# Patient Record
Sex: Female | Born: 1970 | Race: White | Hispanic: No | Marital: Married | Smoking: Never smoker
Health system: Southern US, Community
[De-identification: ages and names within clinical notes are randomized; demographics above are authoritative.]

## PROBLEM LIST (undated history)

## (undated) DIAGNOSIS — G43909 Migraine, unspecified, not intractable, without status migrainosus: Secondary | ICD-10-CM

## (undated) DIAGNOSIS — R112 Nausea with vomiting, unspecified: Secondary | ICD-10-CM

## (undated) DIAGNOSIS — D219 Benign neoplasm of connective and other soft tissue, unspecified: Secondary | ICD-10-CM

## (undated) DIAGNOSIS — I10 Essential (primary) hypertension: Secondary | ICD-10-CM

## (undated) DIAGNOSIS — M7072 Other bursitis of hip, left hip: Secondary | ICD-10-CM

## (undated) DIAGNOSIS — D649 Anemia, unspecified: Secondary | ICD-10-CM

## (undated) DIAGNOSIS — E119 Type 2 diabetes mellitus without complications: Secondary | ICD-10-CM

## (undated) DIAGNOSIS — K219 Gastro-esophageal reflux disease without esophagitis: Secondary | ICD-10-CM

## (undated) DIAGNOSIS — J40 Bronchitis, not specified as acute or chronic: Secondary | ICD-10-CM

## (undated) DIAGNOSIS — Z9889 Other specified postprocedural states: Secondary | ICD-10-CM

## (undated) DIAGNOSIS — E039 Hypothyroidism, unspecified: Secondary | ICD-10-CM

## (undated) DIAGNOSIS — M7071 Other bursitis of hip, right hip: Secondary | ICD-10-CM

## (undated) DIAGNOSIS — R51 Headache: Secondary | ICD-10-CM

## (undated) HISTORY — PX: APPENDECTOMY: SHX54

## (undated) HISTORY — PX: DILATION AND CURETTAGE OF UTERUS: SHX78

## (undated) HISTORY — PX: WISDOM TOOTH EXTRACTION: SHX21

---

## 1997-08-30 ENCOUNTER — Other Ambulatory Visit: Admission: RE | Admit: 1997-08-30 | Discharge: 1997-08-30 | Payer: Self-pay | Admitting: Obstetrics and Gynecology

## 1997-09-28 ENCOUNTER — Ambulatory Visit (HOSPITAL_COMMUNITY): Admission: RE | Admit: 1997-09-28 | Discharge: 1997-09-28 | Payer: Self-pay | Admitting: Obstetrics and Gynecology

## 1998-09-04 ENCOUNTER — Other Ambulatory Visit: Admission: RE | Admit: 1998-09-04 | Discharge: 1998-09-04 | Payer: Self-pay | Admitting: Obstetrics and Gynecology

## 1999-10-01 ENCOUNTER — Other Ambulatory Visit: Admission: RE | Admit: 1999-10-01 | Discharge: 1999-10-01 | Payer: Self-pay | Admitting: Obstetrics and Gynecology

## 2000-12-11 ENCOUNTER — Other Ambulatory Visit: Admission: RE | Admit: 2000-12-11 | Discharge: 2000-12-11 | Payer: Self-pay | Admitting: Obstetrics and Gynecology

## 2000-12-12 ENCOUNTER — Other Ambulatory Visit: Admission: RE | Admit: 2000-12-12 | Discharge: 2000-12-12 | Payer: Self-pay | Admitting: Obstetrics and Gynecology

## 2001-12-14 ENCOUNTER — Other Ambulatory Visit: Admission: RE | Admit: 2001-12-14 | Discharge: 2001-12-14 | Payer: Self-pay | Admitting: Obstetrics and Gynecology

## 2002-12-20 ENCOUNTER — Other Ambulatory Visit: Admission: RE | Admit: 2002-12-20 | Discharge: 2002-12-20 | Payer: Self-pay | Admitting: Obstetrics and Gynecology

## 2011-03-27 ENCOUNTER — Other Ambulatory Visit: Payer: Self-pay | Admitting: Obstetrics and Gynecology

## 2011-03-27 DIAGNOSIS — Z1231 Encounter for screening mammogram for malignant neoplasm of breast: Secondary | ICD-10-CM

## 2011-04-17 ENCOUNTER — Ambulatory Visit
Admission: RE | Admit: 2011-04-17 | Discharge: 2011-04-17 | Disposition: A | Discharge status: Home / Self Care | Payer: BC Managed Care – PPO | Source: Ambulatory Visit | Attending: Obstetrics and Gynecology | Admitting: Obstetrics and Gynecology

## 2011-04-17 DIAGNOSIS — Z1231 Encounter for screening mammogram for malignant neoplasm of breast: Secondary | ICD-10-CM

## 2012-03-09 ENCOUNTER — Other Ambulatory Visit: Payer: Self-pay | Admitting: Obstetrics and Gynecology

## 2012-03-09 DIAGNOSIS — Z1231 Encounter for screening mammogram for malignant neoplasm of breast: Secondary | ICD-10-CM

## 2012-04-17 ENCOUNTER — Ambulatory Visit
Admission: RE | Admit: 2012-04-17 | Discharge: 2012-04-17 | Disposition: A | Discharge status: Home / Self Care | Payer: BC Managed Care – PPO | Source: Ambulatory Visit | Attending: Obstetrics and Gynecology | Admitting: Obstetrics and Gynecology

## 2012-04-17 DIAGNOSIS — Z1231 Encounter for screening mammogram for malignant neoplasm of breast: Secondary | ICD-10-CM

## 2012-12-21 ENCOUNTER — Ambulatory Visit: Payer: Self-pay | Admitting: Neurology

## 2012-12-21 LAB — CREATININE, SERUM
Creatinine: 0.94 mg/dL (ref 0.60–1.30)
EGFR (Non-African Amer.): >60

## 2013-03-16 ENCOUNTER — Other Ambulatory Visit: Payer: Self-pay

## 2013-03-16 DIAGNOSIS — Z1231 Encounter for screening mammogram for malignant neoplasm of breast: Secondary | ICD-10-CM

## 2013-04-26 ENCOUNTER — Ambulatory Visit
Admission: RE | Admit: 2013-04-26 | Discharge: 2013-04-26 | Disposition: A | Discharge status: Home / Self Care | Payer: BC Managed Care – PPO | Source: Ambulatory Visit

## 2013-04-26 DIAGNOSIS — Z1231 Encounter for screening mammogram for malignant neoplasm of breast: Secondary | ICD-10-CM

## 2013-05-27 HISTORY — PX: ABDOMINAL HYSTERECTOMY: SHX81

## 2013-09-07 ENCOUNTER — Encounter (HOSPITAL_COMMUNITY): Payer: Self-pay | Admitting: Pharmacist

## 2013-09-09 ENCOUNTER — Other Ambulatory Visit: Payer: Self-pay | Admitting: Obstetrics and Gynecology

## 2013-09-13 ENCOUNTER — Encounter (HOSPITAL_COMMUNITY)
Admission: RE | Admit: 2013-09-13 | Discharge: 2013-09-13 | Disposition: A | Discharge status: Home / Self Care | Payer: BC Managed Care – PPO | Source: Ambulatory Visit | Attending: Obstetrics and Gynecology | Admitting: Obstetrics and Gynecology

## 2013-09-13 ENCOUNTER — Encounter (HOSPITAL_COMMUNITY): Payer: Self-pay

## 2013-09-13 DIAGNOSIS — Z01812 Encounter for preprocedural laboratory examination: Secondary | ICD-10-CM | POA: Insufficient documentation

## 2013-09-13 DIAGNOSIS — Z01810 Encounter for preprocedural cardiovascular examination: Secondary | ICD-10-CM | POA: Insufficient documentation

## 2013-09-13 HISTORY — DX: Essential (primary) hypertension: I10

## 2013-09-13 HISTORY — DX: Benign neoplasm of connective and other soft tissue, unspecified: D21.9

## 2013-09-13 HISTORY — DX: Other specified postprocedural states: R11.2

## 2013-09-13 HISTORY — DX: Headache: R51

## 2013-09-13 HISTORY — DX: Anemia, unspecified: D64.9

## 2013-09-13 HISTORY — DX: Hypothyroidism, unspecified: E03.9

## 2013-09-13 HISTORY — DX: Nausea with vomiting, unspecified: Z98.890

## 2013-09-13 LAB — CBC
HCT: 34.3 % — ABNORMAL LOW (ref 36.0–46.0)
Hemoglobin: 11.2 g/dL — ABNORMAL LOW (ref 12.0–15.0)
MCH: 29.2 pg (ref 26.0–34.0)
MCHC: 32.7 g/dL (ref 30.0–36.0)
MCV: 89.3 fL (ref 78.0–100.0)
PLATELETS: 237 10*3/uL (ref 150–400)
RBC: 3.84 MIL/uL — AB (ref 3.87–5.11)
RDW: 14.4 % (ref 11.5–15.5)
WBC: 4.1 10*3/uL (ref 4.0–10.5)

## 2013-09-13 LAB — BASIC METABOLIC PANEL
BUN: 15 mg/dL (ref 6–23)
CALCIUM: 8.9 mg/dL (ref 8.4–10.5)
CO2: 24 mEq/L (ref 19–32)
Chloride: 107 mEq/L (ref 96–112)
Creatinine, Ser: 0.86 mg/dL (ref 0.50–1.10)
GFR calc non Af Amer: 82 mL/min — ABNORMAL LOW (ref >90)
Glucose, Bld: 71 mg/dL (ref 70–99)
Potassium: 3.8 mEq/L (ref 3.7–5.3)
SODIUM: 142 meq/L (ref 137–147)

## 2013-09-13 NOTE — Patient Instructions (Addendum)
   Your procedure is scheduled on:  Wednesday, April 29  Enter through the [employer] of [facility] at:   7 AM Pick up the phone at the desk and dial [phone] and inform us of your arrival.  Please call this number if you have any problems the morning of surgery: [phone]  Remember: Do not eat or drink after midnight: Tuesday Take these medicines the morning of surgery with a SIP OF WATER:  Amlodipine, synthroid, topomax  Do not wear jewelry, make-up, or FINGER nail polish No metal in your hair or on your body. Do not wear lotions, powders, perfumes.  You may wear deodorant.  Do not bring valuables to the hospital. Contacts, dentures or bridgework may not be worn into surgery.  Leave suitcase in the car. After Surgery it may be brought to your room. For patients being admitted to the hospital, checkout time is 11:00am the day of discharge.  Home with - sister Vaughan Basta cell [phone].

## 2013-09-21 MED ORDER — CEFAZOLIN SODIUM-DEXTROSE 2-3 GM-% IV SOLR
2.0000 g | INTRAVENOUS | Status: AC
  Administered 2013-09-22: 2 g via INTRAVENOUS

## 2013-09-21 NOTE — H&P (Signed)
NAME:  Beth Williamson, Beth Williamson                  ACCOUNT NO.:  [id number]  MEDICAL RECORD NO.:  [id number]  LOCATION:  PERIO                         FACILITY:  [facility]  PHYSICIAN:  Lovenia Kim, M.D.DATE OF BIRTH:  1970/10/30  DATE OF ADMISSION:  08/12/2013 DATE OF DISCHARGE:  09/13/2013                             HISTORY & PHYSICAL   CHIEF COMPLAINT:  Menometrorrhagia and dysmenorrhea.  HISTORY OF PRESENT ILLNESS:  A 43 year old white female, G2, P1, with refractory menometrorrhagia and dysmenorrhea, who presents for definitive therapy.  History of hypertension, not a candidate for hormonal therapy.  PAST MEDICAL HISTORY:  Remarkable for hypothyroidism, hypertension, migraine headaches, and diet-controlled diabetes.  ALLERGIES:  She has no known drug allergies.  FAMILY HISTORY:  Hypertension, myocardial infarction, heart disease, diabetes.  PREGNANCY HISTORY:  one uncomplicated birth and one uncomplicated spontaneous abortion.  SURGICAL HISTORY:  Appendectomy at age 2.  MEDICATIONS:  Effexor, fish oil, aspirin, multivitamin, Maxalt, Synthroid, Norvasc, Topamax.  PHYSICAL EXAMINATION:  A well-developed, well-nourished white female, in no acute distress. HEENT:  Normal. NECK:  Supple.  Full range of motion. LUNGS:  Clear. HEART:  Regular rate and rhythm. ABDOMEN:  Soft, nontender. PELVIC:  Revealed an anteflexed uterus, bulky, and no adnexal masses. EXTREMITIES:  No cords. NEUROLOGIC:  Nonfocal. SKIN:  Intact.  IMPRESSION:  Menometrorrhagia refractory to medical therapy, desires definitive therapy.  PLAN:  Proceed with da Vinci assisted total laparoscopic hysterectomy, bilateral salpingectomy.  Risks of anesthesia, infection, bleeding, injury to surrounding organs, and possible need for repair was discussed, delayed versus immediate complications to include bowel and bladder injury noted.  The patient acknowledges and wishes to proceed.     Lovenia Kim,  M.D.     RJT/MEDQ  D:  09/21/2013  T:  09/21/2013  Job:  [id number]

## 2013-09-22 ENCOUNTER — Encounter (HOSPITAL_COMMUNITY): Payer: Self-pay | Admitting: Anesthesiology

## 2013-09-22 ENCOUNTER — Encounter (HOSPITAL_COMMUNITY): Payer: BC Managed Care – PPO | Admitting: Anesthesiology

## 2013-09-22 ENCOUNTER — Ambulatory Visit (HOSPITAL_COMMUNITY)
Admission: RE | Admit: 2013-09-22 | Discharge: 2013-09-23 | Disposition: A | Discharge status: Home / Self Care | Payer: BC Managed Care – PPO | Source: Ambulatory Visit | Attending: Obstetrics and Gynecology | Admitting: Obstetrics and Gynecology

## 2013-09-22 ENCOUNTER — Ambulatory Visit (HOSPITAL_COMMUNITY): Payer: BC Managed Care – PPO | Admitting: Anesthesiology

## 2013-09-22 ENCOUNTER — Encounter (HOSPITAL_COMMUNITY)
Admission: RE | Disposition: A | Discharge status: Home / Self Care | Payer: Self-pay | Source: Ambulatory Visit | Attending: Obstetrics and Gynecology

## 2013-09-22 DIAGNOSIS — D252 Subserosal leiomyoma of uterus: Secondary | ICD-10-CM | POA: Insufficient documentation

## 2013-09-22 DIAGNOSIS — N838 Other noninflammatory disorders of ovary, fallopian tube and broad ligament: Secondary | ICD-10-CM | POA: Insufficient documentation

## 2013-09-22 DIAGNOSIS — E119 Type 2 diabetes mellitus without complications: Secondary | ICD-10-CM | POA: Insufficient documentation

## 2013-09-22 DIAGNOSIS — N920 Excessive and frequent menstruation with regular cycle: Secondary | ICD-10-CM | POA: Insufficient documentation

## 2013-09-22 DIAGNOSIS — N946 Dysmenorrhea, unspecified: Secondary | ICD-10-CM | POA: Insufficient documentation

## 2013-09-22 DIAGNOSIS — N8000 Endometriosis of the uterus, unspecified: Secondary | ICD-10-CM | POA: Insufficient documentation

## 2013-09-22 DIAGNOSIS — D649 Anemia, unspecified: Secondary | ICD-10-CM | POA: Insufficient documentation

## 2013-09-22 DIAGNOSIS — D219 Benign neoplasm of connective and other soft tissue, unspecified: Secondary | ICD-10-CM | POA: Diagnosis present

## 2013-09-22 DIAGNOSIS — E039 Hypothyroidism, unspecified: Secondary | ICD-10-CM | POA: Insufficient documentation

## 2013-09-22 DIAGNOSIS — I10 Essential (primary) hypertension: Secondary | ICD-10-CM | POA: Insufficient documentation

## 2013-09-22 LAB — HCG, SERUM, QUALITATIVE: PREG SERUM: NEGATIVE

## 2013-09-22 SURGERY — ROBOTIC SINGLE SITE TOTAL HYSTERECTOMY
Anesthesia: General | Site: Abdomen

## 2013-09-22 MED ORDER — MIDAZOLAM HCL 2 MG/2ML IJ SOLN
INTRAMUSCULAR | Status: DC | PRN
  Administered 2013-09-22: 2 mg via INTRAVENOUS

## 2013-09-22 MED ORDER — DIPHENHYDRAMINE HCL 12.5 MG/5ML PO ELIX
12.5000 mg | ORAL_SOLUTION | Freq: Four times a day (QID) | ORAL | Status: DC | PRN
  Filled 2013-09-22: qty 5

## 2013-09-22 MED ORDER — ONDANSETRON HCL 4 MG/2ML IJ SOLN
INTRAMUSCULAR | Status: AC
  Filled 2013-09-22: qty 2

## 2013-09-22 MED ORDER — NEOSTIGMINE METHYLSULFATE 1 MG/ML IJ SOLN
INTRAMUSCULAR | Status: AC
  Filled 2013-09-22: qty 1

## 2013-09-22 MED ORDER — DEXTROSE IN LACTATED RINGERS 5 % IV SOLN
INTRAVENOUS | Status: DC
  Administered 2013-09-22 (×2): via INTRAVENOUS

## 2013-09-22 MED ORDER — CEFAZOLIN SODIUM-DEXTROSE 2-3 GM-% IV SOLR
INTRAVENOUS | Status: AC
  Filled 2013-09-22: qty 50

## 2013-09-22 MED ORDER — ARTIFICIAL TEARS OP OINT
TOPICAL_OINTMENT | OPHTHALMIC | Status: AC
  Filled 2013-09-22: qty 3.5

## 2013-09-22 MED ORDER — ROCURONIUM BROMIDE 100 MG/10ML IV SOLN
INTRAVENOUS | Status: AC
Start: 2013-09-22 — End: 2013-09-22
  Filled 2013-09-22: qty 1

## 2013-09-22 MED ORDER — DIPHENHYDRAMINE HCL 50 MG/ML IJ SOLN: 12.5000 mg | Freq: Four times a day (QID) | INTRAMUSCULAR | Status: DC | PRN

## 2013-09-22 MED ORDER — GLYCOPYRROLATE 0.2 MG/ML IJ SOLN
INTRAMUSCULAR | Status: AC
  Filled 2013-09-22: qty 3

## 2013-09-22 MED ORDER — LACTATED RINGERS IR SOLN
Status: DC | PRN
Start: 2013-09-22 — End: 2013-09-22
  Administered 2013-09-22: 3000 mL

## 2013-09-22 MED ORDER — FENTANYL CITRATE 0.05 MG/ML IJ SOLN
INTRAMUSCULAR | Status: DC | PRN
  Administered 2013-09-22 (×6): 50 ug via INTRAVENOUS

## 2013-09-22 MED ORDER — ROPIVACAINE HCL 5 MG/ML IJ SOLN
INTRAMUSCULAR | Status: DC | PRN
  Administered 2013-09-22: 120 mL

## 2013-09-22 MED ORDER — KETOROLAC TROMETHAMINE 30 MG/ML IJ SOLN
INTRAMUSCULAR | Status: AC
  Filled 2013-09-22: qty 1

## 2013-09-22 MED ORDER — SCOPOLAMINE 1 MG/3DAYS TD PT72
MEDICATED_PATCH | TRANSDERMAL | Status: DC
Start: 2013-09-22 — End: 2013-09-23
  Administered 2013-09-22: 1.5 mg via TRANSDERMAL
  Filled 2013-09-22: qty 1

## 2013-09-22 MED ORDER — DEXAMETHASONE SODIUM PHOSPHATE 10 MG/ML IJ SOLN
INTRAMUSCULAR | Status: DC | PRN
  Administered 2013-09-22: 10 mg via INTRAVENOUS

## 2013-09-22 MED ORDER — FENTANYL CITRATE 0.05 MG/ML IJ SOLN
INTRAMUSCULAR | Status: AC
  Filled 2013-09-22: qty 5

## 2013-09-22 MED ORDER — PROPOFOL 10 MG/ML IV EMUL
INTRAVENOUS | Status: AC
  Filled 2013-09-22: qty 20

## 2013-09-22 MED ORDER — FENTANYL CITRATE 0.05 MG/ML IJ SOLN
INTRAMUSCULAR | Status: AC
  Filled 2013-09-22: qty 2

## 2013-09-22 MED ORDER — METOCLOPRAMIDE HCL 5 MG/ML IJ SOLN: 10.0000 mg | Freq: Once | INTRAMUSCULAR | Status: DC | PRN

## 2013-09-22 MED ORDER — PROPOFOL 10 MG/ML IV BOLUS
INTRAVENOUS | Status: DC | PRN
  Administered 2013-09-22: 180 mg via INTRAVENOUS

## 2013-09-22 MED ORDER — LIDOCAINE HCL (CARDIAC) 20 MG/ML IV SOLN
INTRAVENOUS | Status: DC | PRN
  Administered 2013-09-22: 70 mg via INTRAVENOUS
  Administered 2013-09-22: 30 mg via INTRAVENOUS

## 2013-09-22 MED ORDER — OXYCODONE-ACETAMINOPHEN 5-325 MG PO TABS
1.0000 | ORAL_TABLET | ORAL | Status: DC | PRN
  Administered 2013-09-23: 1 via ORAL
  Filled 2013-09-22: qty 1

## 2013-09-22 MED ORDER — ONDANSETRON HCL 4 MG/2ML IJ SOLN
INTRAMUSCULAR | Status: DC | PRN
  Administered 2013-09-22: 4 mg via INTRAVENOUS

## 2013-09-22 MED ORDER — TOPIRAMATE 100 MG PO TABS
100.0000 mg | ORAL_TABLET | Freq: Every morning | ORAL | Status: DC
  Filled 2013-09-22: qty 1

## 2013-09-22 MED ORDER — LEVOTHYROXINE SODIUM 137 MCG PO TABS
137.0000 ug | ORAL_TABLET | Freq: Every day | ORAL | Status: DC
  Administered 2013-09-23: 137 ug via ORAL
  Filled 2013-09-22: qty 1

## 2013-09-22 MED ORDER — LACTATED RINGERS IV SOLN
INTRAVENOUS | Status: DC | PRN
  Administered 2013-09-22 (×2): via INTRAVENOUS

## 2013-09-22 MED ORDER — DEXAMETHASONE SODIUM PHOSPHATE 10 MG/ML IJ SOLN
INTRAMUSCULAR | Status: AC
  Filled 2013-09-22: qty 1

## 2013-09-22 MED ORDER — TRAMADOL HCL 50 MG PO TABS
50.0000 mg | ORAL_TABLET | Freq: Four times a day (QID) | ORAL | Status: DC | PRN
  Administered 2013-09-23: 50 mg via ORAL
  Filled 2013-09-22: qty 1

## 2013-09-22 MED ORDER — FENTANYL CITRATE 0.05 MG/ML IJ SOLN
INTRAMUSCULAR | Status: AC
  Administered 2013-09-22: 50 ug via INTRAVENOUS
  Filled 2013-09-22: qty 2

## 2013-09-22 MED ORDER — MIDAZOLAM HCL 2 MG/2ML IJ SOLN
INTRAMUSCULAR | Status: AC
  Filled 2013-09-22: qty 2

## 2013-09-22 MED ORDER — ROPIVACAINE HCL 5 MG/ML IJ SOLN
INTRAMUSCULAR | Status: AC
  Filled 2013-09-22: qty 60

## 2013-09-22 MED ORDER — AMLODIPINE BESYLATE 2.5 MG PO TABS
2.5000 mg | ORAL_TABLET | Freq: Every day | ORAL | Status: DC
  Filled 2013-09-22: qty 1

## 2013-09-22 MED ORDER — NEOSTIGMINE METHYLSULFATE 1 MG/ML IJ SOLN
INTRAMUSCULAR | Status: DC | PRN
  Administered 2013-09-22: 3 mg via INTRAVENOUS

## 2013-09-22 MED ORDER — LACTATED RINGERS IV SOLN
INTRAVENOUS | Status: DC
Start: 2013-09-22 — End: 2013-09-22
  Administered 2013-09-22: 08:00:00 via INTRAVENOUS

## 2013-09-22 MED ORDER — ROCURONIUM BROMIDE 100 MG/10ML IV SOLN
INTRAVENOUS | Status: DC | PRN
  Administered 2013-09-22 (×2): 10 mg via INTRAVENOUS
  Administered 2013-09-22: 50 mg via INTRAVENOUS

## 2013-09-22 MED ORDER — KETOROLAC TROMETHAMINE 30 MG/ML IJ SOLN
INTRAMUSCULAR | Status: DC | PRN
  Administered 2013-09-22: 30 mg via INTRAVENOUS

## 2013-09-22 MED ORDER — LIDOCAINE HCL (CARDIAC) 20 MG/ML IV SOLN
INTRAVENOUS | Status: AC
  Filled 2013-09-22: qty 5

## 2013-09-22 MED ORDER — SODIUM CHLORIDE 0.9 % IJ SOLN
INTRAMUSCULAR | Status: AC
  Filled 2013-09-22: qty 10

## 2013-09-22 MED ORDER — TOPIRAMATE 100 MG PO TABS: 100.0000 mg | ORAL_TABLET | Freq: Two times a day (BID) | ORAL | Status: DC

## 2013-09-22 MED ORDER — FENTANYL CITRATE 0.05 MG/ML IJ SOLN
25.0000 ug | INTRAMUSCULAR | Status: DC | PRN
  Administered 2013-09-22: 50 ug via INTRAVENOUS
  Administered 2013-09-22 (×2): 25 ug via INTRAVENOUS

## 2013-09-22 MED ORDER — TOPIRAMATE 100 MG PO TABS
300.0000 mg | ORAL_TABLET | Freq: Every evening | ORAL | Status: DC
  Filled 2013-09-22: qty 3

## 2013-09-22 MED ORDER — MEPERIDINE HCL 25 MG/ML IJ SOLN: 6.2500 mg | INTRAMUSCULAR | Status: DC | PRN

## 2013-09-22 MED ORDER — SCOPOLAMINE 1 MG/3DAYS TD PT72
1.0000 | MEDICATED_PATCH | TRANSDERMAL | Status: DC
  Administered 2013-09-22: 1.5 mg via TRANSDERMAL

## 2013-09-22 MED ORDER — SODIUM CHLORIDE 0.9 % IJ SOLN: 9.0000 mL | INTRAMUSCULAR | Status: DC | PRN

## 2013-09-22 MED ORDER — ONDANSETRON HCL 4 MG/2ML IJ SOLN
4.0000 mg | Freq: Four times a day (QID) | INTRAMUSCULAR | Status: DC | PRN
  Administered 2013-09-22: 4 mg via INTRAVENOUS
  Filled 2013-09-22: qty 2

## 2013-09-22 MED ORDER — GLYCOPYRROLATE 0.2 MG/ML IJ SOLN
INTRAMUSCULAR | Status: DC | PRN
  Administered 2013-09-22: 0.6 mg via INTRAVENOUS

## 2013-09-22 MED ORDER — HYDROMORPHONE 0.3 MG/ML IV SOLN
INTRAVENOUS | Status: DC
  Administered 2013-09-22: 0.6 mg via INTRAVENOUS
  Administered 2013-09-22: 13:00:00 via INTRAVENOUS
  Administered 2013-09-22: 0.6 mg via INTRAVENOUS
  Filled 2013-09-22: qty 25

## 2013-09-22 MED ORDER — NALOXONE HCL 0.4 MG/ML IJ SOLN: 0.4000 mg | INTRAMUSCULAR | Status: DC | PRN

## 2013-09-22 MED ORDER — SODIUM CHLORIDE 0.9 % IJ SOLN
INTRAMUSCULAR | Status: AC
  Filled 2013-09-22: qty 50

## 2013-09-22 SURGICAL SUPPLY — 74 items
BAG URINE DRAINAGE (UROLOGICAL SUPPLIES) ×3 IMPLANT
BARRIER ADHS 3X4 INTERCEED (GAUZE/BANDAGES/DRESSINGS) ×3 IMPLANT
CANNULA SEAL 5MM (CANNULA) ×1
CANNULA SEALS 8.5MM (CANNULA) ×1
CATH FOLEY 3WAY  5CC 16FR (CATHETERS) ×1
CATH FOLEY 3WAY 5CC 16FR (CATHETERS) ×2 IMPLANT
CHLORAPREP W/TINT 26ML (MISCELLANEOUS) ×3 IMPLANT
CLOTH BEACON ORANGE TIMEOUT ST (SAFETY) ×3 IMPLANT
CONT PATH 16OZ SNAP LID 3702 (MISCELLANEOUS) ×3 IMPLANT
COVER MAYO STAND STRL (DRAPES) ×3 IMPLANT
COVER TABLE BACK 60X90 (DRAPES) ×6 IMPLANT
COVER TIP SHEARS 8 DVNC (MISCELLANEOUS) ×2 IMPLANT
COVER TIP SHEARS 8MM DA VINCI (MISCELLANEOUS) ×1
DECANTER SPIKE VIAL GLASS SM (MISCELLANEOUS) ×12 IMPLANT
DERMABOND ADVANCED (GAUZE/BANDAGES/DRESSINGS) ×1
DERMABOND ADVANCED .7 DNX12 (GAUZE/BANDAGES/DRESSINGS) ×2 IMPLANT
DRAPE HUG U DISPOSABLE (DRAPE) ×3 IMPLANT
DRAPE LG THREE QUARTER DISP (DRAPES) ×6 IMPLANT
DRAPE WARM FLUID 44X44 (DRAPE) ×3 IMPLANT
DRSG TEGADERM 4X4.75 (GAUZE/BANDAGES/DRESSINGS) ×3 IMPLANT
ELECT REM PT RETURN 9FT ADLT (ELECTROSURGICAL) ×3
ELECTRODE REM PT RTRN 9FT ADLT (ELECTROSURGICAL) ×2 IMPLANT
EVACUATOR SMOKE 8.L (FILTER) ×3 IMPLANT
FORCEPS BI-POLAR FEN SS 5MM (FORCEP) ×3 IMPLANT
GAUZE VASELINE 3X9 (GAUZE/BANDAGES/DRESSINGS) IMPLANT
GLOVE BIO SURGEON STRL SZ7.5 (GLOVE) ×6 IMPLANT
GOWN STRL REUS W/TWL LRG LVL3 (GOWN DISPOSABLE) ×18 IMPLANT
GYRUS RUMI II 2.5CM BLUE (DISPOSABLE)
GYRUS RUMI II 3.5CM BLUE (DISPOSABLE)
GYRUS RUMI II 4.0CM BLUE (DISPOSABLE)
KIT ACCESSORY DA VINCI DISP (KITS) ×1
KIT ACCESSORY DVNC DISP (KITS) ×2 IMPLANT
LEGGING LITHOTOMY PAIR STRL (DRAPES) ×3 IMPLANT
NEEDLE INSUFFLATION 120MM (ENDOMECHANICALS) ×3 IMPLANT
NEEDLE INSUFFLATION 150MM (ENDOMECHANICALS) ×3 IMPLANT
OCCLUDER COLPOPNEUMO (BALLOONS) ×3 IMPLANT
PACK LAVH (CUSTOM PROCEDURE TRAY) ×3 IMPLANT
PAD PREP 24X48 CUFFED NSTRL (MISCELLANEOUS) ×6 IMPLANT
PLUG CATH AND CAP STER (CATHETERS) ×3 IMPLANT
PORT ENDOSCOPE SS 8.5MM (MISCELLANEOUS) ×3 IMPLANT
PROTECTOR NERVE ULNAR (MISCELLANEOUS) ×6 IMPLANT
RETRACTOR WOUND ALXS 19CM XSML (INSTRUMENTS) IMPLANT
RTRCTR WOUND ALEXIS 18CM SML (INSTRUMENTS) ×3
RTRCTR WOUND ALEXIS 19CM XSML (INSTRUMENTS)
RUMI II 3.0CM BLUE KOH-EFFICIE (DISPOSABLE) IMPLANT
RUMI II GYRUS 2.5CM BLUE (DISPOSABLE) IMPLANT
RUMI II GYRUS 3.5CM BLUE (DISPOSABLE) IMPLANT
RUMI II GYRUS 4.0CM BLUE (DISPOSABLE) IMPLANT
SAVER CELL AAL HAEMONETICS (INSTRUMENTS) ×2 IMPLANT
SCISSORS LAP 5X35 DISP (ENDOMECHANICALS) ×3 IMPLANT
SEAL CANN 5 DVNC (CANNULA) ×2 IMPLANT
SEAL CANN 8.5 DVNC (CANNULA) ×2 IMPLANT
SET CYSTO W/LG BORE CLAMP LF (SET/KITS/TRAYS/PACK) IMPLANT
SET IRRIG TUBING LAPAROSCOPIC (IRRIGATION / IRRIGATOR) ×3 IMPLANT
SOLUTION ELECTROLUBE (MISCELLANEOUS) ×3 IMPLANT
SPONGE GAUZE 4X4 12PLY (GAUZE/BANDAGES/DRESSINGS) ×3 IMPLANT
SUT VIC AB 0 CT1 27 (SUTURE) ×2
SUT VIC AB 0 CT1 27XBRD ANBCTR (SUTURE) ×4 IMPLANT
SUT VICRYL 0 UR6 27IN ABS (SUTURE) ×6 IMPLANT
SUT VICRYL RAPIDE 4/0 PS 2 (SUTURE) ×6 IMPLANT
SUT VLOC 180 0 9IN  GS21 (SUTURE)
SUT VLOC 180 0 9IN GS21 (SUTURE) IMPLANT
SYR 50ML LL SCALE MARK (SYRINGE) ×3 IMPLANT
SYRINGE 10CC LL (SYRINGE) ×3 IMPLANT
TIP UTERINE 6.7X8CM BLUE DISP (MISCELLANEOUS) ×3 IMPLANT
TOWEL OR 17X24 6PK STRL BLUE (TOWEL DISPOSABLE) ×9 IMPLANT
TROCAR DISP BLADELESS 8 DVNC (TROCAR) ×2 IMPLANT
TROCAR DISP BLADELESS 8MM (TROCAR) ×1
TROCAR XCEL 12X100 BLDLESS (ENDOMECHANICALS) ×6 IMPLANT
TROCAR XCEL NON-BLD 5MMX100MML (ENDOMECHANICALS) ×3 IMPLANT
TROCAR Z-THREAD 12X150 (TROCAR) ×3 IMPLANT
TUBING FILTER THERMOFLATOR (ELECTROSURGICAL) ×3 IMPLANT
WARMER LAPAROSCOPE (MISCELLANEOUS) ×3 IMPLANT
WATER STERILE IRR 1000ML POUR (IV SOLUTION) ×9 IMPLANT

## 2013-09-22 NOTE — Addendum Note (Signed)
Addendum created 09/22/13 1641 by Flossie Dibble, CRNA   Modules edited: Notes Section   Notes Section:  File: [id number]

## 2013-09-22 NOTE — Progress Notes (Signed)
Patient ID: Beth Williamson, female   DOB: [birth year], 43 y.o.   MRN: [id number] Patient seen and examined. Consent witnessed and signed. No changes noted. Update completed. CBC    Component Value Date/Time   WBC 4.1 09/13/2013 0851   RBC 3.84* 09/13/2013 0851   HGB 11.2* 09/13/2013 0851   HCT 34.3* 09/13/2013 0851   PLT 237 09/13/2013 0851   MCV 89.3 09/13/2013 0851   MCH 29.2 09/13/2013 0851   MCHC 32.7 09/13/2013 0851   RDW 14.4 09/13/2013 0851

## 2013-09-22 NOTE — Op Note (Signed)
09/22/2013  10:59 AM  PATIENT:  Beth Williamson  43 y.o. female  PRE-OPERATIVE DIAGNOSIS:  Dysfunctional Uterine Bleeding  POST-OPERATIVE DIAGNOSIS:  Dysfunctional Uterine Bleeding  PROCEDURE:  Procedure(s):  ROBOTIC SINGLE SITE TOTAL HYSTERECTOMY WITH BILATERAL SALPINGECTOMY MCCALL CUL DE PLASTY  SURGEON:  Surgeon(s): Lovenia Kim, MD Princess Bruins, MD  ASSISTANTS: Dellis Filbert, MD   ANESTHESIA:   general  ESTIMATED BLOOD LOSS: * No blood loss amount entered *   DRAINS: Urinary Catheter (Foley)   LOCAL MEDICATIONS USED:  Amount: ropivicaine ml and OTHER 60  SPECIMEN:  Source of Specimen:  uterus , cervix and bilateral tubes  DISPOSITION OF SPECIMEN:  PATHOLOGY  COUNTS:  YES  DICTATION #: [id number]  PLAN OF CARE: DC home  PATIENT DISPOSITION:  PACU - hemodynamically stable.

## 2013-09-22 NOTE — Anesthesia Postprocedure Evaluation (Signed)
  Anesthesia Post-op Note  Patient: Beth Williamson  Procedure(s) Performed: Procedure(s):  ROBOTIC SINGLE SITE TOTAL HYSTERECTOMY WITH BILATERAL SALPINGECTOMY (N/A)  Patient Location: PACU  Anesthesia Type:General  Level of Consciousness: awake, alert  and oriented  Airway and Oxygen Therapy: Patient Spontanous Breathing  Post-op Pain: mild  Post-op Assessment: Post-op Vital signs reviewed, Patient's Cardiovascular Status Stable, Respiratory Function Stable, Patent Airway, No signs of Nausea or vomiting and Pain level controlled  Post-op Vital Signs: Reviewed and stable  Last Vitals:  Filed Vitals:   09/22/13 1200  BP: 104/56  Pulse: 70  Temp: 36.7 C  Resp: 16    Complications: No apparent anesthesia complications

## 2013-09-22 NOTE — Anesthesia Preprocedure Evaluation (Signed)
Anesthesia Evaluation  Patient identified by MRN, date of birth, ID band Patient awake    Reviewed: Allergy & Precautions, H&P , Patient's Chart, lab work & pertinent test results, reviewed documented beta blocker date and time   History of Anesthesia Complications (+) PONV and history of anesthetic complications  Airway Mallampati: II TM Distance: >3 FB Neck ROM: full    Dental   Pulmonary  breath sounds clear to auscultation        Cardiovascular Exercise Tolerance: Good hypertension, Rhythm:regular Rate:Normal     Neuro/Psych  Headaches,    GI/Hepatic   Endo/Other  Hypothyroidism   Renal/GU      Musculoskeletal   Abdominal   Peds  Hematology  (+) anemia ,   Anesthesia Other Findings   Reproductive/Obstetrics                           Anesthesia Physical Anesthesia Plan  ASA: II  Anesthesia Plan: General ETT   Post-op Pain Management:    Induction:   Airway Management Planned:   Additional Equipment:   Intra-op Plan:   Post-operative Plan:   Informed Consent: I have reviewed the patients History and Physical, chart, labs and discussed the procedure including the risks, benefits and alternatives for the proposed anesthesia with the patient or authorized representative who has indicated his/her understanding and acceptance.   Dental Advisory Given  Plan Discussed with: CRNA and Surgeon  Anesthesia Plan Comments:         Anesthesia Quick Evaluation

## 2013-09-22 NOTE — Anesthesia Postprocedure Evaluation (Signed)
Anesthesia Post Note  Patient: Beth Williamson  Procedure(s) Performed: Procedure(s):  ROBOTIC SINGLE SITE TOTAL HYSTERECTOMY WITH BILATERAL SALPINGECTOMY (N/A)  Anesthesia type: General  Patient location: Women's Unit  Post pain: Pain level controlled  Post assessment: Post-op Vital signs reviewed  Last Vitals: BP 107/55  Pulse 102  Temp(Src) 36.8 C (Oral)  Resp 18  Ht 5' 1"  (1.549 m)  Wt 146 lb (66.225 kg)  BMI 27.60 kg/m2  SpO2 99%  LMP 08/30/2013  Post vital signs: Reviewed  Level of consciousness: awake  Complications: No apparent anesthesia complications

## 2013-09-22 NOTE — Transfer of Care (Signed)
Immediate Anesthesia Transfer of Care Note  Patient: Beth Williamson  Procedure(s) Performed: Procedure(s):  ROBOTIC SINGLE SITE TOTAL HYSTERECTOMY WITH BILATERAL SALPINGECTOMY (N/A)  Patient Location: PACU  Anesthesia Type:General  Level of Consciousness: awake, sedated and patient cooperative  Airway & Oxygen Therapy: Patient Spontanous Breathing and Patient connected to nasal cannula oxygen  Post-op Assessment: Report given to PACU RN and Post -op Vital signs reviewed and stable  Post vital signs: Reviewed and stable  Complications: No apparent anesthesia complications

## 2013-09-23 LAB — BASIC METABOLIC PANEL
BUN: 10 mg/dL (ref 6–23)
CHLORIDE: 109 meq/L (ref 96–112)
CO2: 19 meq/L (ref 19–32)
Calcium: 8.6 mg/dL (ref 8.4–10.5)
Creatinine, Ser: 0.73 mg/dL (ref 0.50–1.10)
GFR calc non Af Amer: >90 mL/min (ref >90)
Glucose, Bld: 91 mg/dL (ref 70–99)
Potassium: 3.9 mEq/L (ref 3.7–5.3)
Sodium: 140 mEq/L (ref 137–147)

## 2013-09-23 LAB — CBC
HEMATOCRIT: 30 % — AB (ref 36.0–46.0)
Hemoglobin: 9.9 g/dL — ABNORMAL LOW (ref 12.0–15.0)
MCH: 29.3 pg (ref 26.0–34.0)
MCHC: 33 g/dL (ref 30.0–36.0)
MCV: 88.8 fL (ref 78.0–100.0)
PLATELETS: 201 10*3/uL (ref 150–400)
RBC: 3.38 MIL/uL — ABNORMAL LOW (ref 3.87–5.11)
RDW: 14.7 % (ref 11.5–15.5)
WBC: 9.8 10*3/uL (ref 4.0–10.5)

## 2013-09-23 MED ORDER — MENTHOL 3 MG MT LOZG
1.0000 | LOZENGE | OROMUCOSAL | Status: DC | PRN
  Administered 2013-09-23: 3 mg via ORAL
  Filled 2013-09-23: qty 9

## 2013-09-23 MED ORDER — OXYCODONE-ACETAMINOPHEN 5-325 MG PO TABS: 1.0000 | ORAL_TABLET | ORAL | Status: DC | PRN

## 2013-09-23 MED ORDER — TRAMADOL HCL 50 MG PO TABS: 50.0000 mg | ORAL_TABLET | Freq: Four times a day (QID) | ORAL | Status: DC | PRN

## 2013-09-23 NOTE — Op Note (Signed)
Beth Williamson, Beth Williamson                  ACCOUNT NO.:  [id number]  MEDICAL RECORD NO.:  [id number]  LOCATION:  9305                          FACILITY:  [facility]  PHYSICIAN:  Lovenia Kim, M.D.DATE OF BIRTH:  Jun 10, 1970  DATE OF PROCEDURE: DATE OF DISCHARGE:                              OPERATIVE REPORT   PREOPERATIVE DIAGNOSIS:  Menometrorrhagia and dysmenorrhea.  POSTOPERATIVE DIAGNOSIS:  Menometrorrhagia and dysmenorrhea plus uterine fibroids.  PROCEDURE:  Single-site robotic total laparoscopic hysterectomy with bilateral salpingectomy, McCall culdoplasty.  SURGEON:  Lovenia Kim, MD  ASSISTANT:  Princess Bruins, MD  ANESTHESIA:  General.  ESTIMATED BLOOD LOSS:  50 mL.  COMPLICATIONS:  None.  DRAINS:  Foley.  COUNTS:  Correct.  The patient to recovery in good condition.  BRIEF OPERATIVE NOTE:  After being apprised of risks of anesthesia, infection, bleeding, injury to surrounding organs, possible need for repair, delayed versus immediate complications to include bowel and bladder injury, possible need for repair, the patient was brought to the operating room, was administered general anesthetic without complications, prepped and draped in usual sterile fashion.  Foley catheter placed.  RUMI retractor was placed per vagina.  Attention was turned to the abdomen whereby a 3-cm vertical incision site was measured, a small stab incision was made.  Veress needle placed opening pressure -2, 15 mm trocar placed without difficulty.  Visualization reveals 1 exophytic uterine fibroids.  Bilateral normal tubes and ovaries.  Decision was made to proceed with single site trocar removed, excision extended to include the entire 2.5 cm.  The fascia was exposed and opened in a vertical fashion.  Stay sutures were placed at the cephalad and caudad portions of the incision and held the Alexis retractors placed without difficulty and the GelPort was placed as well. The camera was  placed.  Visualization was achieved with a 30 degree up position Trendelenburg was established.  Robot was docked to the camera. The #1 and 2 arm ports were placed in the standard single site fashion and docked in the appropriate fashion.  A hook and by fenestrated forceps were placed.  At this time, the left tube was traced out the fimbriated end and the mesosalpinx was cauterized along an avascular area.  The tube is excised and removed through the accessory port, same thing was done on the right.  The tubo-ovarian round ligament was cauterized and divided.  The uterine vessels were skeletonized on the left.  The bladder flap was developed using the hook in a sharp fashion, and the RUMI retractor cup was seen bulging through the cervicovaginal junction.  On the left side, the tubo-ovarian ligament was also cauterized and divided.  The round ligament was divided and opened.  The uterine vessels were skeletonized, cauterized, and divided on the right and divided on the left.  At this time, the Occluder balloon is elevated, the hook was used on coagulation to circumferentially detach the specimen which was then retracted into the vagina.  A 2-0 V-Loc suture was used on the vaginal cuff in a continuous running fashion.  A second imbricating layer is placed.  Good hemostasis noted.  The McCall culdoplasty suture was placed as well.  The suture was cut and removed through the accessory port.  Irrigation was accomplished.  Ropivacaine was placed.  All instruments were removed.  GelPort was removed and the Alexis retractor was removed.  The incision was closed using 0 PDS over the fascia in a continuous running fashion.  Stay sutures were plicated in the midline.  The umbilical stalk is recreated using a 3-0 Monocryl suture, and the deep tissues were closed using a 3-0 Monocryl, skin closed using a 4-0 Vicryl.  Good hemostasis was noted.  Pressure dressing was placed.  The patient tolerated the  procedure well, was awakened and transferred to recovery in good condition.     Lovenia Kim, M.D.     RJT/MEDQ  D:  09/22/2013  T:  09/23/2013  Job:  [id number]

## 2013-09-23 NOTE — Progress Notes (Signed)
D/c home ambulated out

## 2013-09-23 NOTE — Progress Notes (Signed)
1 Day Post-Op Procedure(s) (LRB):  ROBOTIC SINGLE SITE TOTAL HYSTERECTOMY WITH BILATERAL SALPINGECTOMY (N/A)  Subjective: Patient reports nausea, incisional pain, tolerating PO, + flatus and no problems voiding.    Objective: I have reviewed patient's vital signs, intake and output, medications and labs. BP 112/65  Pulse 83  Temp(Src) 98.4 F (36.9 C) (Oral)  Resp 18  Ht 5' 1"  (1.549 m)  Wt 66.225 kg (146 lb)  BMI 27.60 kg/m2  SpO2 100%  LMP 08/30/2013    CBC    Component Value Date/Time   WBC 9.8 09/23/2013 0555   RBC 3.38* 09/23/2013 0555   HGB 9.9* 09/23/2013 0555   HCT 30.0* 09/23/2013 0555   PLT 201 09/23/2013 0555   MCV 88.8 09/23/2013 0555   MCH 29.3 09/23/2013 0555   MCHC 33.0 09/23/2013 0555   RDW 14.7 09/23/2013 0555     General: alert, cooperative and appears stated age Resp: clear to auscultation bilaterally and normal percussion bilaterally Cardio: regular rate and rhythm, S1, S2 normal, no murmur, click, rub or gallop and normal apical impulse GI: soft, non-tender; bowel sounds normal; no masses,  no organomegaly, normal findings: aorta normal and bowel sounds normal and incision: clean, dry and intact Extremities: extremities normal, atraumatic, no cyanosis or edema and Homans sign is negative, no sign of DVT Vaginal Bleeding: minimal  Assessment: s/p Procedure(s):  ROBOTIC SINGLE SITE TOTAL HYSTERECTOMY WITH BILATERAL SALPINGECTOMY (N/A): stable, progressing well and tolerating diet  Plan: Advance diet Encourage ambulation Advance to PO medication Discharge home  LOS: 1 day    Lovenia Kim 09/23/2013, 7:15 AM

## 2014-01-10 ENCOUNTER — Other Ambulatory Visit: Payer: Self-pay | Admitting: Obstetrics and Gynecology

## 2014-01-10 DIAGNOSIS — N644 Mastodynia: Secondary | ICD-10-CM

## 2014-01-14 ENCOUNTER — Ambulatory Visit
Admission: RE | Admit: 2014-01-14 | Discharge: 2014-01-14 | Disposition: A | Discharge status: Home / Self Care | Payer: BC Managed Care – PPO | Source: Ambulatory Visit | Attending: Obstetrics and Gynecology | Admitting: Obstetrics and Gynecology

## 2014-01-14 DIAGNOSIS — N644 Mastodynia: Secondary | ICD-10-CM

## 2014-04-15 ENCOUNTER — Other Ambulatory Visit: Payer: Self-pay

## 2014-04-15 DIAGNOSIS — Z1231 Encounter for screening mammogram for malignant neoplasm of breast: Secondary | ICD-10-CM

## 2014-05-24 ENCOUNTER — Ambulatory Visit
Admission: RE | Admit: 2014-05-24 | Discharge: 2014-05-24 | Disposition: A | Discharge status: Home / Self Care | Payer: BC Managed Care – PPO | Source: Ambulatory Visit

## 2014-05-24 DIAGNOSIS — Z1231 Encounter for screening mammogram for malignant neoplasm of breast: Secondary | ICD-10-CM

## 2014-09-08 ENCOUNTER — Emergency Department
Admit: 2014-09-08 | Disposition: A | Discharge status: Home / Self Care | Payer: Self-pay | Admitting: Emergency Medicine

## 2015-05-23 ENCOUNTER — Other Ambulatory Visit: Payer: Self-pay

## 2015-05-23 DIAGNOSIS — Z1231 Encounter for screening mammogram for malignant neoplasm of breast: Secondary | ICD-10-CM

## 2015-05-23 DIAGNOSIS — Z853 Personal history of malignant neoplasm of breast: Secondary | ICD-10-CM

## 2015-06-12 ENCOUNTER — Ambulatory Visit
Admission: RE | Admit: 2015-06-12 | Discharge: 2015-06-12 | Disposition: A | Discharge status: Home / Self Care | Payer: BLUE CROSS/BLUE SHIELD | Source: Ambulatory Visit

## 2015-06-12 DIAGNOSIS — Z1231 Encounter for screening mammogram for malignant neoplasm of breast: Secondary | ICD-10-CM

## 2015-06-20 ENCOUNTER — Encounter: Payer: Self-pay | Admitting: *Deleted

## 2015-06-20 ENCOUNTER — Emergency Department
Admission: EM | Admit: 2015-06-20 | Discharge: 2015-06-20 | Disposition: A | Discharge status: Home / Self Care | Payer: BLUE CROSS/BLUE SHIELD | Attending: Emergency Medicine | Admitting: Emergency Medicine

## 2015-06-20 DIAGNOSIS — R51 Headache: Secondary | ICD-10-CM | POA: Diagnosis present

## 2015-06-20 DIAGNOSIS — Z79899 Other long term (current) drug therapy: Secondary | ICD-10-CM | POA: Insufficient documentation

## 2015-06-20 DIAGNOSIS — I10 Essential (primary) hypertension: Secondary | ICD-10-CM | POA: Insufficient documentation

## 2015-06-20 DIAGNOSIS — G43909 Migraine, unspecified, not intractable, without status migrainosus: Secondary | ICD-10-CM | POA: Diagnosis not present

## 2015-06-20 MED ORDER — KETOROLAC TROMETHAMINE 30 MG/ML IJ SOLN
30.0000 mg | Freq: Once | INTRAMUSCULAR | Status: AC
  Administered 2015-06-20: 30 mg via INTRAVENOUS
  Filled 2015-06-20: qty 1

## 2015-06-20 MED ORDER — DEXAMETHASONE SODIUM PHOSPHATE 10 MG/ML IJ SOLN
10.0000 mg | Freq: Once | INTRAMUSCULAR | Status: AC
  Administered 2015-06-20: 10 mg via INTRAVENOUS
  Filled 2015-06-20: qty 1

## 2015-06-20 MED ORDER — MORPHINE SULFATE (PF) 4 MG/ML IV SOLN
4.0000 mg | Freq: Once | INTRAVENOUS | Status: AC
  Administered 2015-06-20: 4 mg via INTRAVENOUS
  Filled 2015-06-20: qty 1

## 2015-06-20 MED ORDER — SODIUM CHLORIDE 0.9 % IV BOLUS (SEPSIS)
1000.0000 mL | Freq: Once | INTRAVENOUS | Status: AC
  Administered 2015-06-20: 1000 mL via INTRAVENOUS

## 2015-06-20 MED ORDER — KETOROLAC TROMETHAMINE 10 MG PO TABS: 10.0000 mg | ORAL_TABLET | Freq: Four times a day (QID) | ORAL | Status: DC | PRN

## 2015-06-20 MED ORDER — METOCLOPRAMIDE HCL 5 MG/ML IJ SOLN
10.0000 mg | Freq: Once | INTRAMUSCULAR | Status: AC
  Administered 2015-06-20: 10 mg via INTRAVENOUS
  Filled 2015-06-20: qty 2

## 2015-06-20 MED ORDER — BUTALBITAL-APAP-CAFFEINE 50-325-40 MG PO TABS
1.0000 | ORAL_TABLET | Freq: Four times a day (QID) | ORAL | Status: AC | PRN
Start: 2015-06-20 — End: 2016-06-19

## 2015-06-20 MED ORDER — PENTAFLUOROPROP-TETRAFLUOROETH EX AERO
INHALATION_SPRAY | CUTANEOUS | Status: AC
  Filled 2015-06-20: qty 30

## 2015-06-20 MED ORDER — DIPHENHYDRAMINE HCL 50 MG/ML IJ SOLN
12.5000 mg | Freq: Once | INTRAMUSCULAR | Status: AC
  Administered 2015-06-20: 12.5 mg via INTRAVENOUS
  Filled 2015-06-20: qty 1

## 2015-06-20 NOTE — Discharge Instructions (Signed)
Recurrent Migraine Headache A migraine headache is an intense, throbbing pain on one or both sides of your head. Recurrent migraines keep coming back. A migraine can last for 30 minutes to several hours. CAUSES  The exact cause of a migraine headache is not always known. However, a migraine may be caused when nerves in the brain become irritated and release chemicals that cause inflammation. This causes pain. Certain things may also trigger migraines, such as:   Alcohol.  Smoking.  Stress.  Menstruation.  Aged cheeses.  Foods or drinks that contain nitrates, glutamate, aspartame, or tyramine.  Lack of sleep.  Chocolate.  Caffeine.  Hunger.  Physical exertion.  Fatigue.  Medicines used to treat chest pain (nitroglycerine), birth control pills, estrogen, and some blood pressure medicines. SYMPTOMS   Pain on one or both sides of your head.  Pulsating or throbbing pain.  Severe pain that prevents daily activities.  Pain that is aggravated by any physical activity.  Nausea, vomiting, or both.  Dizziness.  Pain with exposure to bright lights, loud noises, or activity.  General sensitivity to bright lights, loud noises, or smells. Before you get a migraine, you may get warning signs that a migraine is coming (aura). An aura may include:  Seeing flashing lights.  Seeing bright spots, halos, or zigzag lines.  Having tunnel vision or blurred vision.  Having feelings of numbness or tingling.  Having trouble talking.  Having muscle weakness. DIAGNOSIS  A recurrent migraine headache is often diagnosed based on:  Symptoms.  Physical examination.  A CT scan or MRI of your head. These imaging tests cannot diagnose migraines but can help rule out other causes of headaches.  TREATMENT  Medicines may be given for pain and nausea. Medicines can also be given to help prevent recurrent migraines. HOME CARE INSTRUCTIONS  Only take over-the-counter or prescription  medicines for pain or discomfort as directed by your health care provider. The use of long-term narcotics is not recommended.  Lie down in a dark, quiet room when you have a migraine.  Keep a journal to find out what may trigger your migraine headaches. For example, write down:  What you eat and drink.  How much sleep you get.  Any change to your diet or medicines.  Limit alcohol consumption.  Quit smoking if you smoke.  Get 7-9 hours of sleep, or as recommended by your health care provider.  Limit stress.  Keep lights dim if bright lights bother you and make your migraines worse. SEEK MEDICAL CARE IF:   You do not get relief from the medicines given to you.  You have a recurrence of pain.  You have a fever. SEEK IMMEDIATE MEDICAL CARE IF:  Your migraine becomes severe.  You have a stiff neck.  You have loss of vision.  You have muscular weakness or loss of muscle control.  You start losing your balance or have trouble walking.  You feel faint or pass out.  You have severe symptoms that are different from your first symptoms. MAKE SURE YOU:   Understand these instructions.  Will watch your condition.  Will get help right away if you are not doing well or get worse.   This information is not intended to replace advice given to you by your health care provider. Make sure you discuss any questions you have with your health care provider.   Document Released: 02/05/2001 Document Revised: 06/03/2014 Document Reviewed: 01/18/2013 Elsevier Interactive Patient Education [employer].

## 2015-06-20 NOTE — ED Notes (Signed)
Per family she developed a migraine yesterday..with pos n/v and light sensitivity  Placed in dry room   Family at bedside

## 2015-06-20 NOTE — ED Provider Notes (Signed)
[facility] Emergency Department Provider Note ____________________________________________  Time seen: Approximately 11:25 AM  I have reviewed the triage vital signs and the nursing notes.   HISTORY  Chief Complaint Migraine  HPI Beth Williamson is a 45 y.o. female who presents to the emergency department for evaluation of headache.   Location: Right side Similar to previous headaches: Yes Duration: Constant TIMING :3pm yesterday SEVERITY: Severe QUALITY: Throbbing CONTEXT:No known exposures.  MODIFYING FACTORS: No relief with Zomig or Imitrex ASSOCIATED SYMPTOMS: Nausea  Past Medical History  Diagnosis Date  . Hypothyroidism   . Hypertension     borderline  . Headache(784.0)   . SVD (spontaneous vaginal delivery) 1997    x 1  . PONV (postoperative nausea and vomiting)   . Anemia     history  . Fibroids     Patient Active Problem List   Diagnosis Date Noted  . Fibroids 09/22/2013    Past Surgical History  Procedure Laterality Date  . Appendectomy      age 61  . Dilation and curettage of uterus      MAB  . Wisdom tooth extraction      Current Outpatient Rx  Name  Route  Sig  Dispense  Refill  . amLODipine (NORVASC) 2.5 MG tablet   Oral   Take 2.5 mg by mouth daily.         . butalbital-acetaminophen-caffeine (FIORICET) 50-325-40 MG tablet   Oral   Take 1-2 tablets by mouth every 6 (six) hours as needed for headache.   20 tablet   0   . ketorolac (TORADOL) 10 MG tablet   Oral   Take 1 tablet (10 mg total) by mouth every 6 (six) hours as needed.   20 tablet   0   . levothyroxine (SYNTHROID, LEVOTHROID) 137 MCG tablet   Oral   Take 137 mcg by mouth daily before breakfast.         . Multiple Vitamin (MULTIVITAMIN WITH MINERALS) TABS tablet   Oral   Take 1 tablet by mouth daily.         Marland Kitchen oxyCODONE-acetaminophen (PERCOCET/ROXICET) 5-325 MG per tablet   Oral   Take 1-2 tablets by mouth every 4 (four) hours as needed  for severe pain.   40 tablet   0   . rizatriptan (MAXALT-MLT) 10 MG disintegrating tablet   Oral   Take 10 mg by mouth as needed for migraine. May repeat in 2 hours if needed         . topiramate (TOPAMAX) 100 MG tablet   Oral   Take 100-300 mg by mouth 2 (two) times daily. Takes 100 mg in the morning and 300 mg in the evening         . traMADol (ULTRAM) 50 MG tablet   Oral   Take 1-2 tablets (50-100 mg total) by mouth every 6 (six) hours as needed for moderate pain.   30 tablet   0     Allergies Review of patient's allergies indicates no known allergies.  No family history on file.  Social History Social History  Substance Use Topics  . Smoking status: Never Smoker   . Smokeless tobacco: Never Used  . Alcohol Use: No    Review of Systems Constitutional: No fever/chills Eyes: No visual changes. ENT: No sore throat. Cardiovascular: Denies chest pain. Respiratory: Denies shortness of breath. Gastrointestinal: No abdominal pain.  Positive for nausea, no vomiting.  No diarrhea.  No constipation. Genitourinary: Negative  for dysuria or incontinence. Musculoskeletal: Negative for pain. Skin: Negative for rash. Neurological:Positive for headache on the right frontal to temporal lobes, no focal weakness or numbness. No confusion or fainting. Psychiatric:No anxiety or depression  10-point ROS otherwise unremarkable.  ____________________________________________   PHYSICAL EXAM:  VITAL SIGNS: ED Triage Vitals  Enc Vitals Group     BP 06/20/15 1104 119/81 mmHg     Pulse Rate 06/20/15 1104 98     Resp 06/20/15 1104 20     Temp 06/20/15 1104 97.7 F (36.5 C)     Temp Source 06/20/15 1104 Oral     SpO2 06/20/15 1104 99 %     Weight 06/20/15 1104 148 lb (67.132 kg)     Height 06/20/15 1104 5' (1.524 m)     Head Cir --      Peak Flow --      Pain Score 06/20/15 1104 10     Pain Loc --      Pain Edu? --      Excl. in [place]? --     Constitutional: Alert and  oriented. Well appearing and in no acute distress. Eyes: Conjunctivae are normal. PERRL. EOMI. No evidence of papilledema on limited exam. Head: Atraumatic. Nose: No congestion/rhinnorhea. Mouth/Throat: Mucous membranes are moist. Neck: No stridor. No meningismus.   Cardiovascular: Normal rate, regular rhythm. Good peripheral circulation. Respiratory: Normal respiratory effort.  No retractions. Gastrointestinal: Soft and nontender. No distention. No abdominal bruits. No CVA tenderness. Musculoskeletal: No lower extremity tenderness nor edema.  No joint effusions. Neurologic:  Normal speech and language. No gross focal neurologic deficits are appreciated. No gait instability.  Cranial nerves: 2-10 normal as tested.  Cerebellar: Normal Romberg, finger-nose-finger, normal gait. Sensorimotor: No aphasia, pronator drift, clonus, sensory loss or abnormal reflexes.  Skin:  Skin is warm, dry and intact. No rash noted. Psychiatric: Mood and affect are normal. Speech and behavior are normal. Normal thought process and cognition.  ____________________________________________   LABS (all labs ordered are listed, but only abnormal results are displayed)  Labs Reviewed - No data to display ____________________________________________  EKG  Not indicated. ____________________________________________  RADIOLOGY  Not indicated. ____________________________________________   PROCEDURES  Procedure(s) performed: None  Critical Care performed: No  ____________________________________________   INITIAL IMPRESSION / ASSESSMENT AND PLAN / ED COURSE  Pertinent labs & imaging results that were available during my care of the patient were reviewed by me and considered in my medical decision making (see chart for details).  IV NS, toradol IV, reglan IV, and benadryl ordered. Will also try 2L of O2 as well and reassess.  ----------------------------------------- 12:05 PM on  06/20/2015 -----------------------------------------  Pain has decreased "a little." Decadron and Morphine ordered. Will reassess.  1:07 PM on 06/20/2015  Patient's pain now well controlled. Requesting discharge.  Patient was advised to follow up with the primary care provider or neurology for symptoms that are not relieved or improved over the next 24 hours. Also advised to return to the emergency department for symptoms that change or worsen if unable to schedule an appointment. ____________________________________________   FINAL CLINICAL IMPRESSION(S) / ED DIAGNOSES  Final diagnoses:  Migraine without status migrainosus, not intractable, unspecified migraine type     Victorino Dike, FNP 06/20/15 1440  Earleen Newport, MD 06/20/15 1452

## 2015-06-20 NOTE — ED Notes (Signed)
Pt has a history of migraines, pt describes headache as no different from previous migraines, migraine started yesterday with nausea and vomiting, all prescribed medications

## 2016-02-27 ENCOUNTER — Encounter: Payer: Self-pay | Admitting: Emergency Medicine

## 2016-02-27 ENCOUNTER — Emergency Department
Admission: EM | Admit: 2016-02-27 | Discharge: 2016-02-27 | Disposition: A | Discharge status: Home / Self Care | Payer: BLUE CROSS/BLUE SHIELD | Attending: Emergency Medicine | Admitting: Emergency Medicine

## 2016-02-27 DIAGNOSIS — E039 Hypothyroidism, unspecified: Secondary | ICD-10-CM | POA: Insufficient documentation

## 2016-02-27 DIAGNOSIS — I10 Essential (primary) hypertension: Secondary | ICD-10-CM | POA: Insufficient documentation

## 2016-02-27 DIAGNOSIS — Z79899 Other long term (current) drug therapy: Secondary | ICD-10-CM | POA: Insufficient documentation

## 2016-02-27 DIAGNOSIS — G43001 Migraine without aura, not intractable, with status migrainosus: Secondary | ICD-10-CM | POA: Insufficient documentation

## 2016-02-27 DIAGNOSIS — R51 Headache: Secondary | ICD-10-CM | POA: Diagnosis present

## 2016-02-27 MED ORDER — ONDANSETRON 4 MG PO TBDP
4.0000 mg | ORAL_TABLET | Freq: Once | ORAL | Status: AC
Start: 2016-02-27 — End: 2016-02-27
  Administered 2016-02-27: 4 mg via ORAL
  Filled 2016-02-27: qty 1

## 2016-02-27 MED ORDER — SODIUM CHLORIDE 0.9 % IV BOLUS (SEPSIS)
500.0000 mL | Freq: Once | INTRAVENOUS | Status: AC
  Administered 2016-02-27: 500 mL via INTRAVENOUS

## 2016-02-27 MED ORDER — HYDROMORPHONE HCL 1 MG/ML IJ SOLN
1.0000 mg | Freq: Once | INTRAMUSCULAR | Status: AC
  Administered 2016-02-27: 1 mg via INTRAMUSCULAR
  Filled 2016-02-27: qty 1

## 2016-02-27 MED ORDER — KETOROLAC TROMETHAMINE 30 MG/ML IJ SOLN
30.0000 mg | Freq: Once | INTRAMUSCULAR | Status: AC
  Administered 2016-02-27: 30 mg via INTRAVENOUS
  Filled 2016-02-27: qty 1

## 2016-02-27 MED ORDER — DEXAMETHASONE SODIUM PHOSPHATE 10 MG/ML IJ SOLN
10.0000 mg | Freq: Once | INTRAMUSCULAR | Status: AC
  Administered 2016-02-27: 10 mg via INTRAMUSCULAR
  Filled 2016-02-27: qty 1

## 2016-02-27 MED ORDER — DIPHENHYDRAMINE HCL 50 MG/ML IJ SOLN
12.5000 mg | Freq: Once | INTRAMUSCULAR | Status: AC
Start: 2016-02-27 — End: 2016-02-27
  Administered 2016-02-27: 12.5 mg via INTRAVENOUS
  Filled 2016-02-27: qty 1

## 2016-02-27 MED ORDER — HYDROMORPHONE HCL 1 MG/ML IJ SOLN
0.5000 mg | Freq: Once | INTRAMUSCULAR | Status: AC
  Administered 2016-02-27: 0.5 mg via INTRAVENOUS
  Filled 2016-02-27: qty 1

## 2016-02-27 MED ORDER — ONDANSETRON HCL 4 MG/2ML IJ SOLN
4.0000 mg | Freq: Once | INTRAMUSCULAR | Status: AC
  Administered 2016-02-27: 4 mg via INTRAVENOUS
  Filled 2016-02-27: qty 2

## 2016-02-27 NOTE — ED Notes (Signed)
Pt stating that her HA has not decreased as of right now. HA is primarily on the right side of her head. Pt's lights were turned down and family is at bedside.

## 2016-02-27 NOTE — ED Provider Notes (Signed)
[facility] Emergency Department Provider Note   ____________________________________________   None    (approximate)  I have reviewed the triage vital signs and the nursing notes.   HISTORY  Chief Complaint Headache    HPI Beth Williamson is a 45 y.o. female patient arrival to ER complaining of severe migraine headache. Patient state the headache started this morning upon awakening. Patient took Toradol and prescription  prescribed by her neurologist and received no relief. Patient has a 25 year history of migraine headaches. Patient currently takes prophylactic medication, aborted medication, and received Botox injections. Patient state next Botox injection is next month. Patient complaining of nausea and photophobia. Patient states the headaches always become worse near help Botox injection.    Past Medical History:  Diagnosis Date  . Anemia    history  . Fibroids   . Headache(784.0)   . Hypertension    borderline  . Hypothyroidism   . PONV (postoperative nausea and vomiting)   . SVD (spontaneous vaginal delivery) 1997   x 1    Patient Active Problem List   Diagnosis Date Noted  . Fibroids 09/22/2013    Past Surgical History:  Procedure Laterality Date  . APPENDECTOMY     age 64  . DILATION AND CURETTAGE OF UTERUS     MAB  . WISDOM TOOTH EXTRACTION      Prior to Admission medications   Medication Sig Start Date End Date Taking? Authorizing Provider  amLODipine (NORVASC) 2.5 MG tablet Take 2.5 mg by mouth daily.    Historical Provider, MD  butalbital-acetaminophen-caffeine (FIORICET) 50-325-40 MG tablet Take 1-2 tablets by mouth every 6 (six) hours as needed for headache. 06/20/15 06/19/16  Victorino Dike, FNP  ketorolac (TORADOL) 10 MG tablet Take 1 tablet (10 mg total) by mouth every 6 (six) hours as needed. 06/20/15   Victorino Dike, FNP  levothyroxine (SYNTHROID, LEVOTHROID) 137 MCG tablet Take 137 mcg by mouth daily before  breakfast.    Historical Provider, MD  Multiple Vitamin (MULTIVITAMIN WITH MINERALS) TABS tablet Take 1 tablet by mouth daily.    Historical Provider, MD  oxyCODONE-acetaminophen (PERCOCET/ROXICET) 5-325 MG per tablet Take 1-2 tablets by mouth every 4 (four) hours as needed for severe pain. 09/23/13   Brien Few, MD  rizatriptan (MAXALT-MLT) 10 MG disintegrating tablet Take 10 mg by mouth as needed for migraine. May repeat in 2 hours if needed    Historical Provider, MD  topiramate (TOPAMAX) 100 MG tablet Take 100-300 mg by mouth 2 (two) times daily. Takes 100 mg in the morning and 300 mg in the evening    Historical Provider, MD  traMADol (ULTRAM) 50 MG tablet Take 1-2 tablets (50-100 mg total) by mouth every 6 (six) hours as needed for moderate pain. 09/23/13   Brien Few, MD    Allergies Review of patient's allergies indicates no known allergies.  History reviewed. No pertinent family history.  Social History Social History  Substance Use Topics  . Smoking status: Never Smoker  . Smokeless tobacco: Never Used  . Alcohol use No    Review of Systems Constitutional: No fever/chills Eyes: No visual changes. ENT: No sore throat. Cardiovascular: Denies chest pain. Respiratory: Denies shortness of breath. Gastrointestinal: No abdominal pain.  No nausea, no vomiting.  No diarrhea.  No constipation. Genitourinary: Negative for dysuria. Musculoskeletal: Negative for back pain. Skin: Negative for rash. Neurological: Negative for headaches,But denies focal weakness or numbness. Endocrine:Hypertension and hypothyroidism Hematological/Lymphatic: Allergic/Immunilogical: **} 10-point ROS  otherwise negative.  ____________________________________________   PHYSICAL EXAM:  VITAL SIGNS: ED Triage Vitals  Enc Vitals Group     BP 02/27/16 1257 129/73     Pulse Rate 02/27/16 1257 91     Resp 02/27/16 1257 20     Temp 02/27/16 1257 98 F (36.7 C)     Temp Source 02/27/16 1257  Oral     SpO2 02/27/16 1257 97 %     Weight 02/27/16 1250 150 lb (68 kg)     Height 02/27/16 1250 5' 1"  (1.549 m)     Head Circumference --      Peak Flow --      Pain Score 02/27/16 1250 10     Pain Loc --      Pain Edu? --      Excl. in [place]? --     Constitutional: Alert and oriented. Well appearing and in no acute distress. Eyes: Photophobic Head: Atraumatic. Nose: No congestion/rhinnorhea. Mouth/Throat: Mucous membranes are moist.  Oropharynx non-erythematous. Neck: No stridor.  No cervical spine tenderness to palpation. Hematological/Lymphatic/Immunilogical: No cervical lymphadenopathy. Cardiovascular: Normal rate, regular rhythm. Grossly normal heart sounds.  Good peripheral circulation. Respiratory: Normal respiratory effort.  No retractions. Lungs CTAB. Gastrointestinal: Soft and nontender. No distention. No abdominal bruits. No CVA tenderness. Musculoskeletal: No lower extremity tenderness nor edema.  No joint effusions. Neurologic:  Normal speech and language. No gross focal neurologic deficits are appreciated. No gait instability. Skin:  Skin is warm, dry and intact. No rash noted. Psychiatric: Mood and affect are normal. Speech and behavior are normal.  ____________________________________________   LABS (all labs ordered are listed, but only abnormal results are displayed)  Labs Reviewed - No data to display ____________________________________________  EKG   ____________________________________________  RADIOLOGY   ____________________________________________   PROCEDURES  Procedure(s) performed: None  Procedures  Critical Care performed: No  ____________________________________________   INITIAL IMPRESSION / ASSESSMENT AND PLAN / ED COURSE  Pertinent labs & imaging results that were available during my care of the patient were reviewed by me and considered in my medical decision making (see chart for details). Migraine headache. Patient given  discharge Instructions. Advised patient to continue her prophylactic and abortive medications pinned in help Botox injection. Follow-up with neurologist as needed.  Clinical Course  She was reassessed at 1340 hrs. state noimprovement status post Dilaudid and Decadron. We'll start an IV normal saline, Toradol, and Benadryl. 1500 hrs. patient's stay headaches almost resolved.   ____________________________________________   FINAL CLINICAL IMPRESSION(S) / ED DIAGNOSES  Final diagnoses:  Migraine without aura and with status migrainosus, not intractable      NEW MEDICATIONS STARTED DURING THIS VISIT:  New Prescriptions   No medications on file     Note:  This document was prepared using Dragon voice recognition software and may include unintentional dictation errors.    Sable Feil, PA-C 02/27/16 [place], MD 02/28/16 [phone]

## 2016-02-27 NOTE — ED Triage Notes (Signed)
Pt to ed with c/o headache,  States started this am, took toradol and rx per dr Manuella Ghazi

## 2016-02-27 NOTE — ED Notes (Addendum)
Brought in by family with migraine  States she woke up this am with the migraine  She took toradol at work this am w/o relief  Placed on stretcher in dark room  Family at bedside

## 2016-02-27 NOTE — ED Notes (Signed)
Pt is now sitting up and saying that the pain is slightly better. Pt stating that she just "has a gnawing feeling" in the back of her head.

## 2016-02-27 NOTE — ED Notes (Signed)
Pt is resting with lights off in room and family is at the bedside. Medications give at this time.

## 2016-03-08 ENCOUNTER — Other Ambulatory Visit: Payer: Self-pay | Admitting: Neurology

## 2016-03-08 DIAGNOSIS — R42 Dizziness and giddiness: Secondary | ICD-10-CM

## 2016-03-20 ENCOUNTER — Ambulatory Visit: Payer: BLUE CROSS/BLUE SHIELD

## 2016-06-14 ENCOUNTER — Other Ambulatory Visit: Payer: Self-pay | Admitting: Obstetrics and Gynecology

## 2016-06-14 DIAGNOSIS — Z1231 Encounter for screening mammogram for malignant neoplasm of breast: Secondary | ICD-10-CM

## 2016-09-25 ENCOUNTER — Other Ambulatory Visit: Payer: Self-pay | Admitting: Obstetrics and Gynecology

## 2016-09-25 DIAGNOSIS — Z1231 Encounter for screening mammogram for malignant neoplasm of breast: Secondary | ICD-10-CM

## 2016-10-10 ENCOUNTER — Ambulatory Visit
Admission: RE | Admit: 2016-10-10 | Discharge: 2016-10-10 | Disposition: A | Discharge status: Home / Self Care | Payer: BLUE CROSS/BLUE SHIELD | Source: Ambulatory Visit | Attending: Obstetrics and Gynecology | Admitting: Obstetrics and Gynecology

## 2016-10-10 DIAGNOSIS — Z1231 Encounter for screening mammogram for malignant neoplasm of breast: Secondary | ICD-10-CM

## 2017-04-19 DIAGNOSIS — J40 Bronchitis, not specified as acute or chronic: Secondary | ICD-10-CM

## 2017-04-19 HISTORY — DX: Bronchitis, not specified as acute or chronic: J40

## 2017-04-23 ENCOUNTER — Encounter: Payer: Self-pay | Admitting: *Deleted

## 2017-04-23 ENCOUNTER — Other Ambulatory Visit: Payer: Self-pay

## 2017-05-01 NOTE — Discharge Instructions (Signed)
[facility] [facility]  What is Functional Endoscopic Sinus Surgery?  The Surgery involves making the natural openings of the sinuses larger by removing the bony partitions that separate the sinuses from the nasal cavity.  The natural sinus lining is preserved as much as possible to allow the sinuses to resume normal function after the surgery.  In some patients nasal polyps (excessively swollen lining of the sinuses) may be removed to relieve obstruction of the sinus openings.  The surgery is performed through the nose using lighted scopes, which eliminates the need for incisions on the face.  A septoplasty is a different procedure which is sometimes performed with sinus surgery.  It involves straightening the boy partition that separates the two sides of your nose.  A crooked or deviated septum may need repair if is obstructing the sinuses or nasal airflow.  Turbinate reduction is also often performed during sinus surgery.  The turbinates are bony proturberances from the side walls of the nose which swell and can obstruct the nose in patients with sinus and allergy problems.  Their size can be surgically reduced to help relieve nasal obstruction.  What Can Sinus Surgery Do For Me?  Sinus surgery can reduce the frequency of sinus infections requiring antibiotic treatment.  This can provide improvement in nasal congestion, post-nasal drainage, facial pressure and nasal obstruction.  Surgery will NOT prevent you from ever having an infection again, so it usually only for patients who get infections 4 or more times yearly requiring antibiotics, or for infections that do not clear with antibiotics.  It will not cure nasal allergies, so patients with allergies may still require medication to treat their allergies after surgery. Surgery may improve headaches related to sinusitis, however, some people will continue to  require medication to control sinus headaches related to allergies.  Surgery will do nothing for other forms of headache (migraine, tension or cluster).  What Are the Risks of Endoscopic Sinus Surgery?  Current techniques allow surgery to be performed safely with little risk, however, there are rare complications that patients should be aware of.  Because the sinuses are located around the eyes, there is risk of eye injury, including blindness, though again, this would be quite rare. This is usually a result of bleeding behind the eye during surgery, which puts the vision oat risk, though there are treatments to protect the vision and prevent permanent disrupted by surgery causing a leak of the spinal fluid that surrounds the brain.  More serious complications would include bleeding inside the brain cavity or damage to the brain.  Again, all of these complications are uncommon, and spinal fluid leaks can be safely managed surgically if they occur.  The most common complication of sinus surgery is bleeding from the nose, which may require packing or cauterization of the nose.  Continued sinus have polyps may experience recurrence of the polyps requiring revision surgery.  Alterations of sense of smell or injury to the tear ducts are also rare complications.   What is the Surgery Like, and what is the Recovery?  The Surgery usually takes a couple of hours to perform, and is usually performed under a general anesthetic (completely asleep).  Patients are usually discharged home after a couple of hours.  Sometimes during surgery it is necessary to pack the nose to control bleeding, and the packing is left in place for 24 - 48 hours, and removed by your surgeon.  If a septoplasty was performed during the procedure, there is often a splint placed which must be removed after 5-7 days.   Discomfort: Pain is usually mild to moderate, and can be controlled by prescription pain medication or acetaminophen (Tylenol).   Aspirin, Ibuprofen (Advil, Motrin), or Naprosyn (Aleve) should be avoided, as they can cause increased bleeding.  Most patients feel sinus pressure like they have a bad head cold for several days.  Sleeping with your head elevated can help reduce swelling and facial pressure, as can ice packs over the face.  A humidifier may be helpful to keep the mucous and blood from drying in the nose.   Diet: There are no specific diet restrictions, however, you should generally start with clear liquids and a light diet of bland foods because the anesthetic can cause some nausea.  Advance your diet depending on how your stomach feels.  Taking your pain medication with food will often help reduce stomach upset which pain medications can cause.  Nasal Saline Irrigation: It is important to remove blood clots and dried mucous from the nose as it is healing.  This is done by having you irrigate the nose at least 3 - 4 times daily with a salt water solution.  We recommend using NeilMed Sinus Rinse (available at the drug store).  Fill the squeeze bottle with the solution, bend over a sink, and insert the tip of the squeeze bottle into the nose  of an inch.  Point the tip of the squeeze bottle towards the inside corner of the eye on the same side your irrigating.  Squeeze the bottle and gently irrigate the nose.  If you bend forward as you do this, most of the fluid will flow back out of the nose, instead of down your throat.   The solution should be warm, near body temperature, when you irrigate.   Each time you irrigate, you should use a full squeeze bottle.   Note that if you are instructed to use Nasal Steroid Sprays at any time after your surgery, irrigate with saline BEFORE using the steroid spray, so you do not wash it all out of the nose. Another product, Nasal Saline Gel (such as AYR Nasal Saline Gel) can be applied in each nostril 3 - 4 times daily to moisture the nose and reduce scabbing or crusting.  Bleeding:   Bloody drainage from the nose can be expected for several days, and patients are instructed to irrigate their nose frequently with salt water to help remove mucous and blood clots.  The drainage may be dark red or brown, though some fresh blood may be seen intermittently, especially after irrigation.  Do not blow you nose, as bleeding may occur. If you must sneeze, keep your mouth open to allow air to escape through your mouth.  If heavy bleeding occurs: Irrigate the nose with saline to rinse out clots, then spray the nose 3 - 4 times with Afrin Nasal Decongestant Spray.  The spray will constrict the blood vessels to slow bleeding.  Pinch the lower half of your nose shut to apply pressure, and lay down with your head elevated.  Ice packs over the nose may help as well. If bleeding persists despite these measures, you should notify your doctor.  Do not use the Afrin routinely to control nasal congestion after surgery, as it can result in worsening congestion and may affect healing.     Activity: Return to work varies among patients. Most patients will be  out of work at least 5 - 7 days to recover.  Patient may return to work after they are off of narcotic pain medication, and feeling well enough to perform the functions of their job.  Patients must avoid heavy lifting (over 10 pounds) or strenuous physical for 2 weeks after surgery, so your employer may need to assign you to light duty, or keep you out of work longer if light duty is not possible.  NOTE: you should not drive, operate dangerous machinery, do any mentally demanding tasks or make any important legal or financial decisions while on narcotic pain medication and recovering from the general anesthetic.    Call Your Doctor Immediately if You Have Any of the Following: 1. Bleeding that you cannot control with the above measures 2. Loss of vision, double vision, bulging of the eye or black eyes. 3. Fever over 101 degrees 4. Neck stiffness with  severe headache, fever, nausea and change in mental state. You are always encourage to call anytime with concerns, however, please call with requests for pain medication refills during office hours.  Office Endoscopy: During follow-up visits your doctor will remove any packing or splints that may have been placed and evaluate and clean your sinuses endoscopically.  Topical anesthetic will be used to make this as comfortable as possible, though you may want to take your pain medication prior to the visit.  How often this will need to be done varies from patient to patient.  After complete recovery from the surgery, you may need follow-up endoscopy from time to time, particularly if there is concern of recurrent infection or nasal polyps.   General Anesthesia, Adult, Care After These instructions provide you with information about caring for yourself after your procedure. Your health care provider may also give you more specific instructions. Your treatment has been planned according to current medical practices, but problems sometimes occur. Call your health care provider if you have any problems or questions after your procedure. What can I expect after the procedure? After the procedure, it is common to have:  Vomiting.  A sore throat.  Mental slowness.  It is common to feel:  Nauseous.  Cold or shivery.  Sleepy.  Tired.  Sore or achy, even in parts of your body where you did not have surgery.  Follow these instructions at home: For at least 24 hours after the procedure:  Do not: ? Participate in activities where you could fall or become injured. ? Drive. ? Use heavy machinery. ? Drink alcohol. ? Take sleeping pills or medicines that cause drowsiness. ? Make important decisions or sign legal documents. ? Take care of children on your own.  Rest. Eating and drinking  If you vomit, drink water, juice, or soup when you can drink without vomiting.  Drink enough fluid to  keep your urine clear or pale yellow.  Make sure you have little or no nausea before eating solid foods.  Follow the diet recommended by your health care provider. General instructions  Have a responsible adult stay with you until you are awake and alert.  Return to your normal activities as told by your health care provider. Ask your health care provider what activities are safe for you.  Take over-the-counter and prescription medicines only as told by your health care provider.  If you smoke, do not smoke without supervision.  Keep all follow-up visits as told by your health care provider. This is important. Contact a health care provider if:  You  continue to have nausea or vomiting at home, and medicines are not helpful.  You cannot drink fluids or start eating again.  You cannot urinate after 8-12 hours.  You develop a skin rash.  You have fever.  You have increasing redness at the site of your procedure. Get help right away if:  You have difficulty breathing.  You have chest pain.  You have unexpected bleeding.  You feel that you are having a life-threatening or urgent problem. This information is not intended to replace advice given to you by your health care provider. Make sure you discuss any questions you have with your health care provider. Document Released: 08/19/2000 Document Revised: 10/16/2015 Document Reviewed: 04/27/2015 Elsevier Interactive Patient Education  [employer].

## 2017-05-02 ENCOUNTER — Ambulatory Visit: Payer: BLUE CROSS/BLUE SHIELD | Admitting: Anesthesiology

## 2017-05-02 ENCOUNTER — Encounter
Admission: RE | Disposition: A | Discharge status: Home / Self Care | Payer: Self-pay | Source: Ambulatory Visit | Attending: Unknown Physician Specialty

## 2017-05-02 ENCOUNTER — Ambulatory Visit
Admission: RE | Admit: 2017-05-02 | Discharge: 2017-05-02 | Disposition: A | Discharge status: Home / Self Care | Payer: BLUE CROSS/BLUE SHIELD | Source: Ambulatory Visit | Attending: Unknown Physician Specialty | Admitting: Unknown Physician Specialty

## 2017-05-02 DIAGNOSIS — J342 Deviated nasal septum: Secondary | ICD-10-CM | POA: Diagnosis present

## 2017-05-02 DIAGNOSIS — Z79899 Other long term (current) drug therapy: Secondary | ICD-10-CM | POA: Diagnosis not present

## 2017-05-02 DIAGNOSIS — R51 Headache: Secondary | ICD-10-CM | POA: Diagnosis not present

## 2017-05-02 DIAGNOSIS — K219 Gastro-esophageal reflux disease without esophagitis: Secondary | ICD-10-CM | POA: Insufficient documentation

## 2017-05-02 DIAGNOSIS — E039 Hypothyroidism, unspecified: Secondary | ICD-10-CM | POA: Insufficient documentation

## 2017-05-02 DIAGNOSIS — J343 Hypertrophy of nasal turbinates: Secondary | ICD-10-CM | POA: Diagnosis not present

## 2017-05-02 DIAGNOSIS — E7800 Pure hypercholesterolemia, unspecified: Secondary | ICD-10-CM | POA: Diagnosis not present

## 2017-05-02 DIAGNOSIS — E785 Hyperlipidemia, unspecified: Secondary | ICD-10-CM | POA: Diagnosis not present

## 2017-05-02 DIAGNOSIS — I10 Essential (primary) hypertension: Secondary | ICD-10-CM | POA: Insufficient documentation

## 2017-05-02 DIAGNOSIS — J3489 Other specified disorders of nose and nasal sinuses: Secondary | ICD-10-CM | POA: Diagnosis not present

## 2017-05-02 HISTORY — DX: Other bursitis of hip, right hip: M70.72

## 2017-05-02 HISTORY — DX: Migraine, unspecified, not intractable, without status migrainosus: G43.909

## 2017-05-02 HISTORY — DX: Gastro-esophageal reflux disease without esophagitis: K21.9

## 2017-05-02 HISTORY — DX: Other bursitis of hip, right hip: M70.71

## 2017-05-02 HISTORY — PX: NASAL SEPTOPLASTY W/ TURBINOPLASTY: SHX2070

## 2017-05-02 HISTORY — DX: Bronchitis, not specified as acute or chronic: J40

## 2017-05-02 SURGERY — SEPTOPLASTY, NOSE, WITH NASAL TURBINATE REDUCTION
Anesthesia: General | Laterality: Bilateral

## 2017-05-02 MED ORDER — OXYCODONE HCL 5 MG PO TABS
5.0000 mg | ORAL_TABLET | Freq: Once | ORAL | Status: AC | PRN
  Administered 2017-05-02: 5 mg via ORAL

## 2017-05-02 MED ORDER — OXYCODONE HCL 5 MG/5ML PO SOLN: 5.0000 mg | Freq: Once | ORAL | Status: AC | PRN

## 2017-05-02 MED ORDER — SUCCINYLCHOLINE CHLORIDE 20 MG/ML IJ SOLN
INTRAMUSCULAR | Status: DC | PRN
  Administered 2017-05-02: 80 mg via INTRAVENOUS

## 2017-05-02 MED ORDER — LIDOCAINE-EPINEPHRINE 1 %-1:100000 IJ SOLN
INTRAMUSCULAR | Status: DC | PRN
  Administered 2017-05-02: 10 mL

## 2017-05-02 MED ORDER — MIDAZOLAM HCL 5 MG/5ML IJ SOLN
INTRAMUSCULAR | Status: DC | PRN
  Administered 2017-05-02: 2 mg via INTRAVENOUS

## 2017-05-02 MED ORDER — DEXAMETHASONE SODIUM PHOSPHATE 4 MG/ML IJ SOLN
INTRAMUSCULAR | Status: DC | PRN
  Administered 2017-05-02: 8 mg via INTRAVENOUS

## 2017-05-02 MED ORDER — LIDOCAINE HCL (CARDIAC) 20 MG/ML IV SOLN
INTRAVENOUS | Status: DC | PRN
  Administered 2017-05-02: 50 mg via INTRAVENOUS

## 2017-05-02 MED ORDER — ONDANSETRON HCL 4 MG/2ML IJ SOLN
INTRAMUSCULAR | Status: DC | PRN
  Administered 2017-05-02: 4 mg via INTRAVENOUS

## 2017-05-02 MED ORDER — FENTANYL CITRATE (PF) 100 MCG/2ML IJ SOLN: 25.0000 ug | INTRAMUSCULAR | Status: DC | PRN

## 2017-05-02 MED ORDER — ACETAMINOPHEN 10 MG/ML IV SOLN
1000.0000 mg | Freq: Once | INTRAVENOUS | Status: AC
  Administered 2017-05-02: 1000 mg via INTRAVENOUS

## 2017-05-02 MED ORDER — PROPOFOL 10 MG/ML IV BOLUS
INTRAVENOUS | Status: DC | PRN
  Administered 2017-05-02: 200 mg via INTRAVENOUS
  Administered 2017-05-02: 50 mg via INTRAVENOUS

## 2017-05-02 MED ORDER — OXYCODONE-ACETAMINOPHEN 5-325 MG PO TABS: 1.0000 | ORAL_TABLET | ORAL | 0 refills | Status: DC | PRN

## 2017-05-02 MED ORDER — OXYMETAZOLINE HCL 0.05 % NA SOLN: 6.0000 | Freq: Once | NASAL | Status: DC

## 2017-05-02 MED ORDER — SULFAMETHOXAZOLE-TRIMETHOPRIM 800-160 MG PO TABS: 1.0000 | ORAL_TABLET | Freq: Two times a day (BID) | ORAL | 0 refills | Status: DC

## 2017-05-02 MED ORDER — PHENYLEPHRINE HCL 0.5 % NA SOLN
NASAL | Status: DC | PRN
  Administered 2017-05-02: 30 mL

## 2017-05-02 MED ORDER — LACTATED RINGERS IV SOLN
INTRAVENOUS | Status: DC
  Administered 2017-05-02: 12:00:00 via INTRAVENOUS

## 2017-05-02 MED ORDER — ONDANSETRON HCL 4 MG/2ML IJ SOLN
4.0000 mg | Freq: Once | INTRAMUSCULAR | Status: AC | PRN
  Administered 2017-05-02: 4 mg via INTRAVENOUS

## 2017-05-02 MED ORDER — FENTANYL CITRATE (PF) 100 MCG/2ML IJ SOLN
INTRAMUSCULAR | Status: DC | PRN
  Administered 2017-05-02: 100 ug via INTRAVENOUS

## 2017-05-02 MED ORDER — SCOPOLAMINE 1 MG/3DAYS TD PT72
1.0000 | MEDICATED_PATCH | Freq: Once | TRANSDERMAL | Status: DC
  Administered 2017-05-02: 1.5 mg via TRANSDERMAL

## 2017-05-02 SURGICAL SUPPLY — 25 items
COAG SUCT 10F 3.5MM HAND CTRL (MISCELLANEOUS) ×2 IMPLANT
DRAPE HEAD BAR (DRAPES) ×2 IMPLANT
DRESSING NASL FOAM PST OP SINU (MISCELLANEOUS) ×1 IMPLANT
DRSG NASAL FOAM POST OP SINU (MISCELLANEOUS) ×2
GLOVE BIO SURGEON STRL SZ7.5 (GLOVE) ×4 IMPLANT
HANDLE YANKAUER SUCT BULB TIP (MISCELLANEOUS) ×2 IMPLANT
KIT ROOM TURNOVER OR (KITS) ×2 IMPLANT
NEEDLE HYPO 25GX1X1/2 BEV (NEEDLE) ×2 IMPLANT
PACK DRAPE NASAL/ENT (PACKS) ×2 IMPLANT
PAD GROUND ADULT SPLIT (MISCELLANEOUS) ×2 IMPLANT
SPLINT NASAL SEPTAL BLV .25 LG (MISCELLANEOUS) IMPLANT
SPLINT NASAL SEPTAL BLV .50 ST (MISCELLANEOUS) ×2 IMPLANT
SPONGE NEURO XRAY DETECT 1X3 (DISPOSABLE) ×2 IMPLANT
STRAP BODY AND KNEE 60X3 (MISCELLANEOUS) ×2 IMPLANT
SUT CHROMIC 3-0 (SUTURE) ×1
SUT CHROMIC 3-0 KS 27XMFL CR (SUTURE) ×1
SUT CHROMIC 5-0 (SUTURE)
SUT CHROMIC 5-0 P2 18XMFL CR (SUTURE)
SUT ETHILON 3-0 KS 30 BLK (SUTURE) ×2 IMPLANT
SUT PLAIN GUT 4-0 (SUTURE) IMPLANT
SUTURE CHRMC 3-0 KS 27XMFL CR (SUTURE) ×1 IMPLANT
SUTURE CHRMC 5-0 P2 18XMF CR (SUTURE) IMPLANT
SYRINGE 10CC LL (SYRINGE) ×2 IMPLANT
TOWEL OR 17X26 4PK STRL BLUE (TOWEL DISPOSABLE) ×2 IMPLANT
WATER STERILE IRR 250ML POUR (IV SOLUTION) ×2 IMPLANT

## 2017-05-02 NOTE — Transfer of Care (Signed)
Immediate Anesthesia Transfer of Care Note  Patient: Beth Williamson  Procedure(s) Performed: NASAL SEPTOPLASTY WITH SUBMUCUS RESECTION TURBINATES (Bilateral )  Patient Location: PACU  Anesthesia Type: General ETT  Level of Consciousness: awake, alert  and patient cooperative  Airway and Oxygen Therapy: Patient Spontanous Breathing and Patient connected to supplemental oxygen  Post-op Assessment: Post-op Vital signs reviewed, Patient's Cardiovascular Status Stable, Respiratory Function Stable, Patent Airway and No signs of Nausea or vomiting  Post-op Vital Signs: Reviewed and stable  Complications: No apparent anesthesia complications

## 2017-05-02 NOTE — Anesthesia Postprocedure Evaluation (Signed)
Anesthesia Post Note  Patient: Beth Williamson  Procedure(s) Performed: NASAL SEPTOPLASTY WITH SUBMUCUS RESECTION TURBINATES (Bilateral )  Patient location during evaluation: PACU Anesthesia Type: General Level of consciousness: awake and alert and oriented Pain management: satisfactory to patient Vital Signs Assessment: post-procedure vital signs reviewed and stable Respiratory status: spontaneous breathing, nonlabored ventilation and respiratory function stable Cardiovascular status: blood pressure returned to baseline and stable Postop Assessment: Adequate PO intake and No signs of nausea or vomiting Anesthetic complications: no    Raliegh Ip

## 2017-05-02 NOTE — Anesthesia Preprocedure Evaluation (Signed)
Anesthesia Evaluation  Patient identified by MRN, date of birth, ID band Patient awake    Reviewed: Allergy & Precautions, H&P , NPO status , Patient's Chart, lab work & pertinent test results  History of Anesthesia Complications (+) PONV and history of anesthetic complications  Airway Mallampati: II  TM Distance: >3 FB Neck ROM: full    Dental no notable dental hx.    Pulmonary    Pulmonary exam normal breath sounds clear to auscultation       Cardiovascular hypertension, Normal cardiovascular exam Rhythm:regular Rate:Normal     Neuro/Psych    GI/Hepatic GERD  ,  Endo/Other  Hypothyroidism   Renal/GU      Musculoskeletal   Abdominal   Peds  Hematology   Anesthesia Other Findings   Reproductive/Obstetrics                             Anesthesia Physical Anesthesia Plan  ASA: II  Anesthesia Plan: General ETT   Post-op Pain Management:    Induction:   PONV Risk Score and Plan: 4 or greater and Scopolamine patch - Pre-op, Ondansetron, Dexamethasone and Midazolam  Airway Management Planned:   Additional Equipment:   Intra-op Plan:   Post-operative Plan:   Informed Consent: I have reviewed the patients History and Physical, chart, labs and discussed the procedure including the risks, benefits and alternatives for the proposed anesthesia with the patient or authorized representative who has indicated his/her understanding and acceptance.     Plan Discussed with: CRNA  Anesthesia Plan Comments:         Anesthesia Quick Evaluation

## 2017-05-02 NOTE — Op Note (Signed)
PREOPERATIVE DIAGNOSIS:  Chronic nasal obstruction.  POSTOPERATIVE DIAGNOSIS:  Chronic nasal obstruction.  SURGEON:  Roena Malady, M.D.  NAME OF PROCEDURE:  1. Nasal septoplasty. 2. Submucous resection of inferior turbinates.  OPERATIVE FINDINGS:  Severe nasal septal deformity, hypertrophy of the inferior turbinates.   DESCRIPTION OF THE PROCEDURE:  Beth Williamson was identified in the holding area and taken to the operating room and placed in the supine position.  After general endotracheal anesthesia was induced, the table was turned 45 degrees and the patient was placed in a semi-Fowler position.  The nose was then topically anesthetized with Lidocaine, cotton pledgets were placed within each nostril. After approximately 5 minutes, this was removed at which time a local anesthetic of 1% Lidocaine 1:100,000 units of Epinephrine was used to inject the inferior turbinates in the nasal septum. A total of 10 ml was used. Examination of the nose showed a severe left nasal septal deformity and tremendous hypertrophied inferior turbinate.  Beginning on the right hand side a hemitransfixion incision was then created on the leading edge of the septum on the right.  A subperichondrial plane was elevated posteriorly on the left and taken back to the perpendicular plate of the ethmoid where subperiosteal plane was elevated posteriorly on the left.   An inferior rim of cartilage was removed anteriorly with care taken to leave an anterior strut to prevent nasal collapse. With this strut removed the perpendicular plate of the ethmoid was separated from the quadrangular cartilage. The large septal spur was removed.  The septum was then replaced in the midline. Reinspection through each nostril showed excellent reduction of the septal deformity. A left posterior inferior fenestration was then created to allow hematoma drainage.  With the septoplasty completed, beginning on the left-hand side, a 15 blade was used to  incise along the inferior edge of the inferior turbinate. A superior laterally based flap was then elevated. The underlying conchal bone of mucosa was excised using Knight scissors. The flap was then laid back over the turbinate stump and cauterized using suction cautery. In a similar fashion the submucous resection was performed on the right.  With the submucous resection completed bilaterally and no active bleeding, the hemitransfixion incision was then closed using two interrupted 3-0 chromic sutures.  Plastic nasal septal splints were placed within each nostril and affixed to the septum using a 3-0 nylon suture. Stammberger was then used beneath each inferior turbinate for hemostasis.    The patient tolerated the procedure well, was returned to anesthesia, extubated in the operating room, and taken to the recovery room in stable condition.    CULTURES:  None.  SPECIMENS:  None.  ESTIMATED BLOOD LOSS:  25 cc.  Beth Williamson  05/02/2017  12:20 PM

## 2017-05-02 NOTE — Anesthesia Procedure Notes (Signed)
Procedure Name: Intubation Date/Time: 05/02/2017 11:55 AM Performed by: Londell Moh, CRNA Pre-anesthesia Checklist: Patient identified, Emergency Drugs available, Suction available, Patient being monitored and Timeout performed Patient Re-evaluated:Patient Re-evaluated prior to induction Oxygen Delivery Method: Circle system utilized Preoxygenation: Pre-oxygenation with 100% oxygen Induction Type: IV induction Ventilation: Mask ventilation without difficulty Laryngoscope Size: Mac and 3 Grade View: Grade II Tube type: Oral Rae Tube size: 7.0 mm Number of attempts: 1 Placement Confirmation: ETT inserted through vocal cords under direct vision,  positive ETCO2 and breath sounds checked- equal and bilateral Tube secured with: Tape Dental Injury: Teeth and Oropharynx as per pre-operative assessment

## 2017-05-02 NOTE — H&P (Signed)
The patient's history has been reviewed, patient examined, no change in status, stable for surgery.  Questions were answered to the patients satisfaction.  

## 2017-05-03 ENCOUNTER — Encounter: Payer: Self-pay | Admitting: Unknown Physician Specialty

## 2017-08-27 ENCOUNTER — Other Ambulatory Visit: Payer: Self-pay | Admitting: Obstetrics and Gynecology

## 2017-08-27 DIAGNOSIS — Z1231 Encounter for screening mammogram for malignant neoplasm of breast: Secondary | ICD-10-CM

## 2017-10-17 ENCOUNTER — Ambulatory Visit
Admission: RE | Admit: 2017-10-17 | Discharge: 2017-10-17 | Disposition: A | Discharge status: Home / Self Care | Payer: BLUE CROSS/BLUE SHIELD | Source: Ambulatory Visit | Attending: Obstetrics and Gynecology | Admitting: Obstetrics and Gynecology

## 2017-10-17 DIAGNOSIS — Z1231 Encounter for screening mammogram for malignant neoplasm of breast: Secondary | ICD-10-CM

## 2018-07-28 ENCOUNTER — Other Ambulatory Visit: Payer: Self-pay | Admitting: Obstetrics and Gynecology

## 2018-07-28 DIAGNOSIS — Z1231 Encounter for screening mammogram for malignant neoplasm of breast: Secondary | ICD-10-CM

## 2018-10-20 ENCOUNTER — Ambulatory Visit: Payer: BLUE CROSS/BLUE SHIELD

## 2018-11-18 ENCOUNTER — Ambulatory Visit
Admission: RE | Admit: 2018-11-18 | Discharge: 2018-11-18 | Disposition: A | Discharge status: Home / Self Care | Payer: BC Managed Care – PPO | Source: Ambulatory Visit | Attending: Obstetrics and Gynecology | Admitting: Obstetrics and Gynecology

## 2018-11-18 ENCOUNTER — Other Ambulatory Visit: Payer: Self-pay

## 2018-11-18 DIAGNOSIS — Z1231 Encounter for screening mammogram for malignant neoplasm of breast: Secondary | ICD-10-CM

## 2019-10-27 ENCOUNTER — Other Ambulatory Visit: Payer: Self-pay | Admitting: Obstetrics and Gynecology

## 2019-10-27 DIAGNOSIS — Z1231 Encounter for screening mammogram for malignant neoplasm of breast: Secondary | ICD-10-CM

## 2019-11-19 ENCOUNTER — Ambulatory Visit
Admission: RE | Admit: 2019-11-19 | Discharge: 2019-11-19 | Disposition: A | Discharge status: Home / Self Care | Payer: BC Managed Care – PPO | Source: Ambulatory Visit | Attending: Obstetrics and Gynecology | Admitting: Obstetrics and Gynecology

## 2019-11-19 ENCOUNTER — Other Ambulatory Visit: Payer: Self-pay

## 2019-11-19 DIAGNOSIS — Z1231 Encounter for screening mammogram for malignant neoplasm of breast: Secondary | ICD-10-CM

## 2019-12-11 ENCOUNTER — Ambulatory Visit: Payer: BC Managed Care – PPO

## 2020-02-15 ENCOUNTER — Ambulatory Visit: Payer: BC Managed Care – PPO

## 2020-09-07 LAB — EXTERNAL GENERIC LAB PROCEDURE: COLOGUARD: NEGATIVE

## 2020-09-07 LAB — COLOGUARD: COLOGUARD: NEGATIVE

## 2021-03-15 ENCOUNTER — Other Ambulatory Visit: Payer: Self-pay | Admitting: Obstetrics and Gynecology

## 2021-03-15 DIAGNOSIS — Z1231 Encounter for screening mammogram for malignant neoplasm of breast: Secondary | ICD-10-CM

## 2021-04-13 ENCOUNTER — Ambulatory Visit
Admission: RE | Admit: 2021-04-13 | Discharge: 2021-04-13 | Disposition: A | Discharge status: Home / Self Care | Payer: BC Managed Care – PPO | Source: Ambulatory Visit | Attending: Obstetrics and Gynecology | Admitting: Obstetrics and Gynecology

## 2021-04-13 ENCOUNTER — Other Ambulatory Visit: Payer: Self-pay

## 2021-04-13 DIAGNOSIS — Z1231 Encounter for screening mammogram for malignant neoplasm of breast: Secondary | ICD-10-CM

## 2021-04-16 ENCOUNTER — Other Ambulatory Visit: Payer: Self-pay | Admitting: Obstetrics and Gynecology

## 2021-04-16 DIAGNOSIS — R928 Other abnormal and inconclusive findings on diagnostic imaging of breast: Secondary | ICD-10-CM

## 2021-05-16 ENCOUNTER — Other Ambulatory Visit: Payer: BC Managed Care – PPO

## 2022-03-29 ENCOUNTER — Other Ambulatory Visit: Payer: Self-pay | Admitting: Orthopedic Surgery

## 2022-03-29 DIAGNOSIS — M7712 Lateral epicondylitis, left elbow: Secondary | ICD-10-CM

## 2022-03-30 ENCOUNTER — Ambulatory Visit
Admission: RE | Admit: 2022-03-30 | Discharge: 2022-03-30 | Disposition: A | Discharge status: Home / Self Care | Payer: BC Managed Care – PPO | Source: Ambulatory Visit | Attending: Orthopedic Surgery | Admitting: Orthopedic Surgery

## 2022-03-30 DIAGNOSIS — M7712 Lateral epicondylitis, left elbow: Secondary | ICD-10-CM | POA: Diagnosis present

## 2022-04-26 ENCOUNTER — Other Ambulatory Visit: Payer: Self-pay | Admitting: Surgery

## 2022-04-30 ENCOUNTER — Encounter
Admission: RE | Admit: 2022-04-30 | Discharge: 2022-04-30 | Disposition: A | Discharge status: Home / Self Care | Payer: BC Managed Care – PPO | Source: Ambulatory Visit | Attending: Surgery | Admitting: Surgery

## 2022-04-30 HISTORY — DX: Type 2 diabetes mellitus without complications: E11.9

## 2022-04-30 NOTE — Patient Instructions (Addendum)
Your procedure is scheduled on: Thursday May 02, 2022. Report to Day Surgery inside [facility] 2nd floor, stop by registration desk before getting on elevator.  To find out your arrival time please call [phone] between 1PM - 3PM on Wednesday May 01, 2022.  Remember: Instructions that are not followed completely may result in serious medical risk,  up to and including death, or upon the discretion of your surgeon and anesthesiologist your  surgery may need to be rescheduled.     _X__ 1. Do not eat food after midnight the night before your procedure.                 No chewing gum or hard candies. You may drink clear liquids up to 2 hours                 before you are scheduled to arrive for your surgery- DO not drink clear                 liquids within 2 hours of the start of your surgery.                 Clear Liquids include:  water,Black Coffee or Tea (Do not add                 anything to coffee or tea).  __X_2.   Complete the "Ensure Clear Pre-surgery Clear Carbohydrate Drink" provided to you, 2 hours before arrival. **If you are diabetic you will be provided with an alternative drink, Gatorade Zero or G2.  __X_3.  On the morning of surgery brush your teeth with toothpaste and water, you                may rinse your mouth with mouthwash if you wish.  Do not swallow any toothpaste or mouthwash.     _X__ 4.  No Alcohol for 24 hours before or after surgery.   _X__ 5.  Do Not Smoke or use e-cigarettes For 24 Hours Prior to Your Surgery.                 Do not use any chewable tobacco products for at least 6 hours prior to                 Surgery.  _X__  6.  Do not use any recreational drugs (marijuana, cocaine, heroin, ecstasy, MDMA or other)                For at least one week prior to your surgery.  Combination of these drugs with anesthesia                May have life threatening results.  ____  7.  Bring all medications with you on the  day of surgery if instructed.   __X__ 8.  Notify your doctor if there is any change in your medical condition      (cold, fever, infections).     Do not wear jewelry, make-up, hairpins, clips or nail polish. Do not wear lotions, powders, or perfumes. You may wear deodorant. Do not shave 48 hours prior to surgery. Men may shave face and neck. Do not bring valuables to the hospital.    [facility] is not responsible for any belongings or valuables.  Contacts, dentures or bridgework may not be worn into surgery. Leave your suitcase in the car. After surgery it may be brought to your room. For patients admitted to the  hospital, discharge time is determined by your treatment team.   Patients discharged the day of surgery will not be allowed to drive home.   Make arrangements for someone to be with you for the first 24 hours of your Same Day Discharge.  __X__ Take these medicines the morning of surgery with A SIP OF WATER:    1. topiramate (TOPAMAX) 100 MG   2. levothyroxine (SYNTHROID, LEVOTHROID) 150 MCG   3. pantoprazole (PROTONIX) 40 MG   4.  5.  6.  ____ Fleet Enema (as directed)   __X__ Use CHG Soap (or wipes) as directed  ____ Use Benzoyl Peroxide Gel as instructed  ____ Use inhalers on the day of surgery  __X__ Stop  Semaglutide, 1 MG/DOSE, (OZEMPIC, 1 MG/DOSE,) 4 MG/3ML SOPN 1 week prior to surgery    ____ Take 1/2 of usual insulin dose the night before surgery. No insulin the morning          of surgery.   ____ Call your PCP, cardiologist, or Pulmonologist if taking Coumadin/Plavix/aspirin and ask when to stop before your surgery.   __X__ One Week prior to surgery- Stop Anti-inflammatories such as Ibuprofen, Aleve, Advil, Motrin, meloxicam (MOBIC), diclofenac, etodolac, ketorolac, Toradol, Daypro, piroxicam, Goody's or BC powders. OK TO USE TYLENOL IF NEEDED   __X__ Stop supplements until after surgery.    ____ Bring C-Pap to the hospital.    If you have any  questions regarding your pre-procedure instructions,  Please call Pre-admit Testing at [phone]     Preparing for Surgery with CHLORHEXIDINE GLUCONATE (CHG) Soap  Chlorhexidine Gluconate (CHG) Soap  o An antiseptic cleaner that kills germs and bonds with the skin to continue killing germs even after washing  o Used for showering the night before surgery and morning of surgery  Before surgery, you can play an important role by reducing the number of germs on your skin.  CHG (Chlorhexidine gluconate) soap is an antiseptic cleanser which kills germs and bonds with the skin to continue killing germs even after washing.  Please do not use if you have an allergy to CHG or antibacterial soaps. If your skin becomes reddened/irritated stop using the CHG.  1. Shower the NIGHT BEFORE SURGERY and the MORNING OF SURGERY with CHG soap.  2. If you choose to wash your hair, wash your hair first as usual with your normal shampoo.  3. After shampooing, rinse your hair and body thoroughly to remove the shampoo.  4. Use CHG as you would any other liquid soap. You can apply CHG directly to the skin and wash gently with a scrungie or a clean washcloth.  5. Apply the CHG soap to your body only from the neck down. Do not use on open wounds or open sores. Avoid contact with your eyes, ears, mouth, and genitals (private parts). Wash face and genitals (private parts) with your normal soap.  6. Wash thoroughly, paying special attention to the area where your surgery will be performed.  7. Thoroughly rinse your body with warm water.  8. Do not shower/wash with your normal soap after using and rinsing off the CHG soap.  9. Pat yourself dry with a clean towel.  10. Wear clean pajamas to bed the night before surgery.  12. Place clean sheets on your bed the night of your first shower and do not sleep with pets.  13. Shower again with the CHG soap on the day of surgery prior to arriving at the  hospital.  14.  Do not apply any deodorants/lotions/powders.  15. Please wear clean clothes to the hospital.

## 2022-05-01 ENCOUNTER — Encounter: Payer: Self-pay | Admitting: Urgent Care

## 2022-05-01 ENCOUNTER — Encounter
Admission: RE | Admit: 2022-05-01 | Discharge: 2022-05-01 | Disposition: A | Discharge status: Home / Self Care | Payer: BC Managed Care – PPO | Source: Ambulatory Visit | Attending: Surgery | Admitting: Surgery

## 2022-05-01 DIAGNOSIS — D649 Anemia, unspecified: Secondary | ICD-10-CM | POA: Diagnosis not present

## 2022-05-01 DIAGNOSIS — R9431 Abnormal electrocardiogram [ECG] [EKG]: Secondary | ICD-10-CM | POA: Insufficient documentation

## 2022-05-01 DIAGNOSIS — I10 Essential (primary) hypertension: Secondary | ICD-10-CM | POA: Insufficient documentation

## 2022-05-01 DIAGNOSIS — Z01818 Encounter for other preprocedural examination: Secondary | ICD-10-CM | POA: Insufficient documentation

## 2022-05-01 DIAGNOSIS — E039 Hypothyroidism, unspecified: Secondary | ICD-10-CM | POA: Diagnosis not present

## 2022-05-01 DIAGNOSIS — K219 Gastro-esophageal reflux disease without esophagitis: Secondary | ICD-10-CM | POA: Diagnosis not present

## 2022-05-01 DIAGNOSIS — E1122 Type 2 diabetes mellitus with diabetic chronic kidney disease: Secondary | ICD-10-CM | POA: Diagnosis not present

## 2022-05-01 DIAGNOSIS — E119 Type 2 diabetes mellitus without complications: Secondary | ICD-10-CM | POA: Insufficient documentation

## 2022-05-01 DIAGNOSIS — I129 Hypertensive chronic kidney disease with stage 1 through stage 4 chronic kidney disease, or unspecified chronic kidney disease: Secondary | ICD-10-CM | POA: Diagnosis not present

## 2022-05-01 DIAGNOSIS — D759 Disease of blood and blood-forming organs, unspecified: Secondary | ICD-10-CM | POA: Diagnosis not present

## 2022-05-01 DIAGNOSIS — N1832 Chronic kidney disease, stage 3b: Secondary | ICD-10-CM | POA: Diagnosis not present

## 2022-05-01 DIAGNOSIS — M7712 Lateral epicondylitis, left elbow: Secondary | ICD-10-CM | POA: Diagnosis present

## 2022-05-01 LAB — CBC
HCT: 40.1 % (ref 36.0–46.0)
Hemoglobin: 12.9 g/dL (ref 12.0–15.0)
MCH: 29.3 pg (ref 26.0–34.0)
MCHC: 32.2 g/dL (ref 30.0–36.0)
MCV: 90.9 fL (ref 80.0–100.0)
Platelets: 183 10*3/uL (ref 150–400)
RBC: 4.41 MIL/uL (ref 3.87–5.11)
RDW: 14 % (ref 11.5–15.5)
WBC: 4.5 10*3/uL (ref 4.0–10.5)
nRBC: 0 % (ref 0.0–0.2)

## 2022-05-01 LAB — BASIC METABOLIC PANEL
Anion gap: 8 (ref 5–15)
BUN: 17 mg/dL (ref 6–20)
CO2: 20 mmol/L — ABNORMAL LOW (ref 22–32)
Calcium: 9.3 mg/dL (ref 8.9–10.3)
Chloride: 113 mmol/L — ABNORMAL HIGH (ref 98–111)
Creatinine, Ser: 1.22 mg/dL — ABNORMAL HIGH (ref 0.44–1.00)
GFR, Estimated: 54 mL/min — ABNORMAL LOW (ref >60)
Glucose, Bld: 84 mg/dL (ref 70–99)
Potassium: 3.6 mmol/L (ref 3.5–5.1)
Sodium: 141 mmol/L (ref 135–145)

## 2022-05-01 MED ORDER — CHLORHEXIDINE GLUCONATE 0.12 % MT SOLN: 15.0000 mL | Freq: Once | OROMUCOSAL | Status: AC

## 2022-05-01 MED ORDER — LACTATED RINGERS IV SOLN: INTRAVENOUS | Status: DC

## 2022-05-01 MED ORDER — ORAL CARE MOUTH RINSE: 15.0000 mL | Freq: Once | OROMUCOSAL | Status: AC

## 2022-05-01 MED ORDER — CEFAZOLIN SODIUM-DEXTROSE 2-4 GM/100ML-% IV SOLN
2.0000 g | INTRAVENOUS | Status: AC
  Administered 2022-05-02: 2 g via INTRAVENOUS

## 2022-05-02 ENCOUNTER — Ambulatory Visit
Admission: RE | Admit: 2022-05-02 | Discharge: 2022-05-02 | Disposition: A | Discharge status: Home / Self Care | Payer: BC Managed Care – PPO | Source: Ambulatory Visit | Attending: Surgery | Admitting: Surgery

## 2022-05-02 ENCOUNTER — Ambulatory Visit: Payer: BC Managed Care – PPO | Admitting: Urgent Care

## 2022-05-02 ENCOUNTER — Other Ambulatory Visit: Payer: Self-pay

## 2022-05-02 ENCOUNTER — Encounter: Payer: Self-pay | Admitting: Surgery

## 2022-05-02 ENCOUNTER — Encounter
Admission: RE | Disposition: A | Discharge status: Home / Self Care | Payer: Self-pay | Source: Ambulatory Visit | Attending: Surgery

## 2022-05-02 DIAGNOSIS — E1122 Type 2 diabetes mellitus with diabetic chronic kidney disease: Secondary | ICD-10-CM | POA: Insufficient documentation

## 2022-05-02 DIAGNOSIS — E039 Hypothyroidism, unspecified: Secondary | ICD-10-CM | POA: Insufficient documentation

## 2022-05-02 DIAGNOSIS — M7712 Lateral epicondylitis, left elbow: Secondary | ICD-10-CM | POA: Diagnosis not present

## 2022-05-02 DIAGNOSIS — N1832 Chronic kidney disease, stage 3b: Secondary | ICD-10-CM | POA: Insufficient documentation

## 2022-05-02 DIAGNOSIS — E119 Type 2 diabetes mellitus without complications: Secondary | ICD-10-CM

## 2022-05-02 DIAGNOSIS — D759 Disease of blood and blood-forming organs, unspecified: Secondary | ICD-10-CM | POA: Insufficient documentation

## 2022-05-02 DIAGNOSIS — I129 Hypertensive chronic kidney disease with stage 1 through stage 4 chronic kidney disease, or unspecified chronic kidney disease: Secondary | ICD-10-CM | POA: Insufficient documentation

## 2022-05-02 DIAGNOSIS — D649 Anemia, unspecified: Secondary | ICD-10-CM | POA: Insufficient documentation

## 2022-05-02 DIAGNOSIS — K219 Gastro-esophageal reflux disease without esophagitis: Secondary | ICD-10-CM | POA: Insufficient documentation

## 2022-05-02 DIAGNOSIS — I10 Essential (primary) hypertension: Secondary | ICD-10-CM

## 2022-05-02 HISTORY — PX: ELBOW ARTHROSCOPY: SHX614

## 2022-05-02 LAB — GLUCOSE, CAPILLARY
Glucose-Capillary: 78 mg/dL (ref 70–99)
Glucose-Capillary: 99 mg/dL (ref 70–99)

## 2022-05-02 SURGERY — ARTHROSCOPY, ELBOW
Anesthesia: General | Site: Elbow | Laterality: Left

## 2022-05-02 MED ORDER — OXYCODONE HCL 5 MG PO TABS: 5.0000 mg | ORAL_TABLET | Freq: Once | ORAL | Status: DC | PRN

## 2022-05-02 MED ORDER — FENTANYL CITRATE (PF) 100 MCG/2ML IJ SOLN
INTRAMUSCULAR | Status: AC
  Filled 2022-05-02: qty 2

## 2022-05-02 MED ORDER — FENTANYL CITRATE (PF) 100 MCG/2ML IJ SOLN
25.0000 ug | INTRAMUSCULAR | Status: DC | PRN
  Administered 2022-05-02: 25 ug via INTRAVENOUS

## 2022-05-02 MED ORDER — MIDAZOLAM HCL 2 MG/2ML IJ SOLN
INTRAMUSCULAR | Status: DC | PRN
  Administered 2022-05-02: 2 mg via INTRAVENOUS

## 2022-05-02 MED ORDER — ONDANSETRON HCL 4 MG/2ML IJ SOLN
INTRAMUSCULAR | Status: AC
  Filled 2022-05-02: qty 2

## 2022-05-02 MED ORDER — CEFAZOLIN SODIUM-DEXTROSE 2-4 GM/100ML-% IV SOLN
INTRAVENOUS | Status: AC
  Filled 2022-05-02: qty 100

## 2022-05-02 MED ORDER — OXYCODONE HCL 5 MG/5ML PO SOLN: 5.0000 mg | Freq: Once | ORAL | Status: DC | PRN

## 2022-05-02 MED ORDER — LACTATED RINGERS IV SOLN: INTRAVENOUS | Status: DC

## 2022-05-02 MED ORDER — ONDANSETRON HCL 4 MG/2ML IJ SOLN: 4.0000 mg | Freq: Once | INTRAMUSCULAR | Status: DC | PRN

## 2022-05-02 MED ORDER — PROPOFOL 10 MG/ML IV BOLUS
INTRAVENOUS | Status: DC | PRN
  Administered 2022-05-02: 50 mg via INTRAVENOUS
  Administered 2022-05-02: 150 mg via INTRAVENOUS

## 2022-05-02 MED ORDER — BUPIVACAINE-EPINEPHRINE (PF) 0.5% -1:200000 IJ SOLN
INTRAMUSCULAR | Status: AC
  Filled 2022-05-02: qty 30

## 2022-05-02 MED ORDER — DEXAMETHASONE SODIUM PHOSPHATE 10 MG/ML IJ SOLN
INTRAMUSCULAR | Status: AC
  Filled 2022-05-02: qty 1

## 2022-05-02 MED ORDER — PHENYLEPHRINE HCL (PRESSORS) 10 MG/ML IV SOLN
INTRAVENOUS | Status: DC | PRN
  Administered 2022-05-02: 40 ug via INTRAVENOUS

## 2022-05-02 MED ORDER — BUPIVACAINE-EPINEPHRINE (PF) 0.5% -1:200000 IJ SOLN
INTRAMUSCULAR | Status: DC | PRN
  Administered 2022-05-02: 15 mL

## 2022-05-02 MED ORDER — LACTATED RINGERS IV SOLN: INTRAVENOUS | Status: DC | PRN

## 2022-05-02 MED ORDER — ACETAMINOPHEN 10 MG/ML IV SOLN
INTRAVENOUS | Status: AC
  Filled 2022-05-02: qty 100

## 2022-05-02 MED ORDER — ROCURONIUM BROMIDE 10 MG/ML (PF) SYRINGE
PREFILLED_SYRINGE | INTRAVENOUS | Status: AC
  Filled 2022-05-02: qty 10

## 2022-05-02 MED ORDER — CHLORHEXIDINE GLUCONATE 0.12 % MT SOLN
OROMUCOSAL | Status: AC
  Administered 2022-05-02: 15 mL via OROMUCOSAL
  Filled 2022-05-02: qty 15

## 2022-05-02 MED ORDER — ACETAMINOPHEN 10 MG/ML IV SOLN: 1000.0000 mg | Freq: Once | INTRAVENOUS | Status: DC | PRN

## 2022-05-02 MED ORDER — DEXMEDETOMIDINE HCL IN NACL 80 MCG/20ML IV SOLN
INTRAVENOUS | Status: DC | PRN
  Administered 2022-05-02: 4 ug via BUCCAL
  Administered 2022-05-02 (×2): 8 ug via BUCCAL

## 2022-05-02 MED ORDER — 0.9 % SODIUM CHLORIDE (POUR BTL) OPTIME
TOPICAL | Status: DC | PRN
  Administered 2022-05-02: 250 mL

## 2022-05-02 MED ORDER — MIDAZOLAM HCL 2 MG/2ML IJ SOLN
INTRAMUSCULAR | Status: AC
  Filled 2022-05-02: qty 2

## 2022-05-02 MED ORDER — PHENYLEPHRINE 80 MCG/ML (10ML) SYRINGE FOR IV PUSH (FOR BLOOD PRESSURE SUPPORT)
PREFILLED_SYRINGE | INTRAVENOUS | Status: AC
  Filled 2022-05-02: qty 10

## 2022-05-02 MED ORDER — FENTANYL CITRATE (PF) 100 MCG/2ML IJ SOLN
INTRAMUSCULAR | Status: DC | PRN
  Administered 2022-05-02: 25 ug via INTRAVENOUS
  Administered 2022-05-02: 50 ug via INTRAVENOUS
  Administered 2022-05-02: 25 ug via INTRAVENOUS

## 2022-05-02 MED ORDER — LIDOCAINE HCL (CARDIAC) PF 100 MG/5ML IV SOSY
PREFILLED_SYRINGE | INTRAVENOUS | Status: DC | PRN
  Administered 2022-05-02: 80 mg via INTRAVENOUS

## 2022-05-02 MED ORDER — PROPOFOL 10 MG/ML IV BOLUS
INTRAVENOUS | Status: AC
  Filled 2022-05-02: qty 20

## 2022-05-02 MED ORDER — DEXAMETHASONE SODIUM PHOSPHATE 10 MG/ML IJ SOLN
INTRAMUSCULAR | Status: DC | PRN
  Administered 2022-05-02: 5 mg via INTRAVENOUS

## 2022-05-02 MED ORDER — SUCCINYLCHOLINE CHLORIDE 200 MG/10ML IV SOSY
PREFILLED_SYRINGE | INTRAVENOUS | Status: AC
  Filled 2022-05-02: qty 10

## 2022-05-02 MED ORDER — ACETAMINOPHEN 10 MG/ML IV SOLN
INTRAVENOUS | Status: DC | PRN
  Administered 2022-05-02: 1000 mg via INTRAVENOUS

## 2022-05-02 MED ORDER — TRAMADOL HCL 50 MG PO TABS: 50.0000 mg | ORAL_TABLET | Freq: Four times a day (QID) | ORAL | 0 refills | Status: AC | PRN

## 2022-05-02 MED ORDER — SUCCINYLCHOLINE CHLORIDE 200 MG/10ML IV SOSY
PREFILLED_SYRINGE | INTRAVENOUS | Status: DC | PRN
  Administered 2022-05-02: 100 mg via INTRAVENOUS

## 2022-05-02 MED ORDER — ONDANSETRON HCL 4 MG/2ML IJ SOLN
INTRAMUSCULAR | Status: DC | PRN
  Administered 2022-05-02: 4 mg via INTRAVENOUS

## 2022-05-02 SURGICAL SUPPLY — 60 items
ANCHOR SUT 1.45 SZ 1 SHORT (Anchor) IMPLANT
BASIN GRAD PLASTIC 32OZ STRL (MISCELLANEOUS) IMPLANT
BENZOIN TINCTURE PRP APPL 2/3 (GAUZE/BANDAGES/DRESSINGS) IMPLANT
BLADE FULL RADIUS 3.5 (BLADE) ×1 IMPLANT
BLADE SURG 15 STRL LF DISP TIS (BLADE) IMPLANT
BLADE SURG 15 STRL SS (BLADE) ×1
BNDG COHESIVE 4X5 TAN STRL LF (GAUZE/BANDAGES/DRESSINGS) ×1 IMPLANT
BNDG COHESIVE 6X5 TAN ST LF (GAUZE/BANDAGES/DRESSINGS) ×1 IMPLANT
BNDG ELASTIC 4X5.8 VLCR STR LF (GAUZE/BANDAGES/DRESSINGS) ×1 IMPLANT
BNDG ESMARK 4X12 TAN STRL LF (GAUZE/BANDAGES/DRESSINGS) ×1 IMPLANT
BNDG GAUZE DERMACEA FLUFF 4 (GAUZE/BANDAGES/DRESSINGS) ×1 IMPLANT
BUR ABRADER 4.0 W/FLUTE AQUA (MISCELLANEOUS) ×1 IMPLANT
BURR ABRADER 4.0 W/FLUTE AQUA (MISCELLANEOUS)
CANNULA SHAVER SCOPE W/RIB (CANNULA) ×1 IMPLANT
CHLORAPREP W/TINT 26 (MISCELLANEOUS) ×2 IMPLANT
CUFF TOURN SGL QUICK 24 (TOURNIQUET CUFF)
CUFF TOURN SGL QUICK 34 (TOURNIQUET CUFF)
CUFF TRNQT CYL 24X4X16.5-23 (TOURNIQUET CUFF) IMPLANT
CUFF TRNQT CYL 34X4.125X (TOURNIQUET CUFF) IMPLANT
DRAPE 3/4 80X56 (DRAPES) ×2 IMPLANT
DRAPE ARTHRO LIMB 89X125 STRL (DRAPES) ×1 IMPLANT
DRAPE EXTREMITY 106X87X128.5 (DRAPES) IMPLANT
DRAPE INCISE IOBAN 66X45 STRL (DRAPES) ×1 IMPLANT
DRAPE U-SHAPE 47X51 STRL (DRAPES) ×1 IMPLANT
ELECT REM PT RETURN 9FT ADLT (ELECTROSURGICAL) ×1
ELECTRODE REM PT RTRN 9FT ADLT (ELECTROSURGICAL) ×1 IMPLANT
GAUZE SPONGE 4X4 12PLY STRL (GAUZE/BANDAGES/DRESSINGS) ×1 IMPLANT
GLOVE BIO SURGEON STRL SZ8 (GLOVE) ×2 IMPLANT
GLOVE SURG UNDER LTX SZ8 (GLOVE) ×1 IMPLANT
GOWN STRL REUS W/ TWL LRG LVL3 (GOWN DISPOSABLE) ×2 IMPLANT
GOWN STRL REUS W/ TWL XL LVL3 (GOWN DISPOSABLE) ×2 IMPLANT
GOWN STRL REUS W/TWL LRG LVL3 (GOWN DISPOSABLE) ×2
GOWN STRL REUS W/TWL XL LVL3 (GOWN DISPOSABLE) ×2
IV LACTATED RINGER IRRG 3000ML (IV SOLUTION)
IV LR IRRIG 3000ML ARTHROMATIC (IV SOLUTION) ×3 IMPLANT
KIT TURNOVER KIT A (KITS) ×1 IMPLANT
MANIFOLD NEPTUNE II (INSTRUMENTS) ×2 IMPLANT
MAT ABSORB  FLUID 56X50 GRAY (MISCELLANEOUS)
MAT ABSORB FLUID 56X50 GRAY (MISCELLANEOUS) ×1 IMPLANT
NS IRRIG 1000ML POUR BTL (IV SOLUTION) ×1 IMPLANT
PACK ARTHROSCOPY KNEE (MISCELLANEOUS) ×1 IMPLANT
PAD ABD DERMACEA PRESS 5X9 (GAUZE/BANDAGES/DRESSINGS) ×1 IMPLANT
PENCIL SMOKE EVACUATOR (MISCELLANEOUS) IMPLANT
SLEEVE REMOTE CONTROL 5X12 (DRAPES) IMPLANT
SLING ARM LRG DEEP (SOFTGOODS) ×1 IMPLANT
SLING ARM M TX990204 (SOFTGOODS) ×1 IMPLANT
SPLINT WRIST M LT TX990308 (SOFTGOODS) IMPLANT
SPONGE T-LAP 18X18 ~~LOC~~+RFID (SPONGE) ×1 IMPLANT
STOCKINETTE M/LG 89821 (MISCELLANEOUS) ×1 IMPLANT
STRIP CLOSURE SKIN 1/4X4 (GAUZE/BANDAGES/DRESSINGS) IMPLANT
SUT PROLENE 4 0 PS 2 18 (SUTURE) ×2 IMPLANT
SUT VIC AB 2-0 CT2 27 (SUTURE) IMPLANT
SUT VIC AB 3-0 SH 27 (SUTURE) ×1
SUT VIC AB 3-0 SH 27X BRD (SUTURE) IMPLANT
SYR 20ML LL LF (SYRINGE) IMPLANT
TOWEL OR 17X26 4PK STRL BLUE (TOWEL DISPOSABLE) IMPLANT
TRAP FLUID SMOKE EVACUATOR (MISCELLANEOUS) ×1 IMPLANT
TUBING INFLOW SET DBFLO PUMP (TUBING) ×1 IMPLANT
WAND WEREWOLF FLOW 90D (MISCELLANEOUS) ×1 IMPLANT
WATER STERILE IRR 500ML POUR (IV SOLUTION) ×1 IMPLANT

## 2022-05-02 NOTE — Progress Notes (Signed)
  Perioperative Services Pre-Admission/Anesthesia Testing    Date: 05/02/22  Name: Beth Williamson MRN:   [id number]  Re: GLP-1 clearance and provider recommendations   Planned Surgical Procedure(s):    Case: [id number] Date/Time: 05/02/22 1554   Procedure: DEBRIDEMENT OF COMMON EXTENSOR ORIGIN OF LEFT ELBOW (Left: Elbow)   Anesthesia type: Choice   Pre-op diagnosis: Lateral epicondylitis of left elbow M77.12   Location: [facility] OR ROOM 03 / [place] [facility]   Surgeons: Corky Mull, MD   Clinical Notes:  Patient is scheduled for the above procedure with the indicated provider/surgeon. In review of her medication reconciliation it was noted that patient is on a prescribed GLP-1 medication. Per guidelines issued by the American Society of Anesthesiologists (ASA), it is recommended that these medications be held for 7 days prior to the patient undergoing any type of elective surgical procedure. The patient is taking the following GLP-1 medication:  '[x]'$  SEMAGLUTIDE   '[]'$  EXENATIDE  '[]'$  LIRAGLUTIDE   '[]'$  LIXISENATIDE  '[]'$  DULAGLUTIDE     '[]'$  OTHER GLP-1 medication: _______________  Reached out to prescribing provider Caryl Comes, MD) to make them aware of the guidelines from anesthesia. Given that this patient takes the prescribed GLP-1 medication for her  diabetes diagnosis, rather than for weight loss, recommendations from the prescribing provider were solicited. Prescribing provider made aware of the following so that informed decision/POC can be developed for this patient that may be taking medications belonging to these drug classes:  Oral GLP-1 medications will be held 1 day prior to surgery.  Injectable GLP-1 medications will be held 7 days prior to surgery.  Metformin is routinely held 48 hours prior to surgery due to renal concerns, potential need for contrasted imaging perioperatively, and the potential for tissue hypoxia leading to drug induced lactic acidosis.  All SGLT2i  medications are held 72 hours prior to surgery as they can be associated with the increased potential for developing euglycemic diabetic ketoacidosis (EDKA).   Impression and Plan:  Beth Williamson is on a prescribed GLP-1 medication, which induces the known side effect of decreased gastric emptying. Efforts are bring made to mitigate the risk of perioperative hyperglycemic events, as elevated blood glucose levels have been found to contribute to intra/postoperative complications. Additionally, hyperglycemic extremes can potentially necessitate the postponing of a patient's elective case in order to better optimize perioperative glycemic control, again with the aforementioned guidelines in place. With this in mind, recommendations have been sought from the prescribing provider, who has cleared patient to proceed with holding the prescribed GLP-1 as per the guidelines from the ASA.   Provider recommending: no further recommendations received from the prescribing provider.  Copy of signed clearance and recommendations placed on patient's chart for inclusion in their medical record and for review by the surgical/anesthetic team on the day of her procedure.   Honor Loh, MSN, APRN, FNP-C, CEN [facility]  Peri-operative Services Nurse Practitioner Phone: [phone] 05/02/22 1:16 PM  NOTE: This note has been prepared using Dragon dictation software. Despite my best ability to proofread, there is always the potential that unintentional transcriptional errors may still occur from this process.

## 2022-05-02 NOTE — Transfer of Care (Signed)
Immediate Anesthesia Transfer of Care Note  Patient: Beth Williamson  Procedure(s) Performed: DEBRIDEMENT OF COMMON EXTENSOR ORIGIN OF LEFT ELBOW (Left: Elbow)  Patient Location: General  Anesthesia Type:General  Level of Consciousness: awake, alert , and oriented  Airway & Oxygen Therapy: Patient Spontanous Breathing and Patient connected to face mask oxygen  Post-op Assessment: Report given to RN and Post -op Vital signs reviewed and stable  Post vital signs: Reviewed and stable  Last Vitals:  Vitals Value Taken Time  BP 110/67 05/02/22 1800  Temp 36.3 C 05/02/22 1800  Pulse 72 05/02/22 1808  Resp 12 05/02/22 1803  SpO2 96 % 05/02/22 1808  Vitals shown include unvalidated device data.  Last Pain:  Vitals:   05/02/22 1800  TempSrc:   PainSc: 0-No pain         Complications: No notable events documented.

## 2022-05-02 NOTE — Discharge Instructions (Addendum)
Orthopedic discharge instructions: Keep dressing dry and intact. Keep hand elevated above heart level. May shower after dressing removed on postop day 4 (Monday). Cover wound with Ace wrap or Band-Aids after drying off, then reapply Velcro wrist splint. Apply ice to affected area frequently. Take ES Tylenol or pain medication as prescribed when needed.  Return for follow-up in 10-14 days or as scheduled.   AMBULATORY SURGERY  DISCHARGE INSTRUCTIONS   The drugs that you were given will stay in your system until tomorrow so for the next 24 hours you should not:  Drive an automobile Make any legal decisions Drink any alcoholic beverage   You may resume regular meals tomorrow.  Today it is better to start with liquids and gradually work up to solid foods.  You may eat anything you prefer, but it is better to start with liquids, then soup and crackers, and gradually work up to solid foods.   Please notify your doctor immediately if you have any unusual bleeding, trouble breathing, redness and pain at the surgery site, drainage, fever, or pain not relieved by medication.    Additional Instructions:        Please contact your physician with any problems or Same Day Surgery at [phone], Monday through Friday 6 am to 4 pm, or [place] at [facility] number at [phone].

## 2022-05-02 NOTE — Anesthesia Procedure Notes (Signed)
Procedure Name: Intubation Date/Time: 05/02/2022 4:32 PM  Performed by: Hedda Slade, CRNAPre-anesthesia Checklist: Patient identified, Patient being monitored, Timeout performed, Emergency Drugs available and Suction available Patient Re-evaluated:Patient Re-evaluated prior to induction Oxygen Delivery Method: Circle system utilized Preoxygenation: Pre-oxygenation with 100% oxygen Induction Type: IV induction and Rapid sequence Laryngoscope Size: 3 and McGraph Grade View: Grade I Tube type: Oral Tube size: 7.0 mm Number of attempts: 1 Airway Equipment and Method: Stylet Placement Confirmation: ETT inserted through vocal cords under direct vision, positive ETCO2 and breath sounds checked- equal and bilateral Secured at: 19 cm Tube secured with: Tape Dental Injury: Teeth and Oropharynx as per pre-operative assessment

## 2022-05-02 NOTE — Anesthesia Postprocedure Evaluation (Signed)
Anesthesia Post Note  Patient: Beth Williamson  Procedure(s) Performed: DEBRIDEMENT OF COMMON EXTENSOR ORIGIN OF LEFT ELBOW (Left: Elbow)  Patient location during evaluation: PACU Anesthesia Type: General Level of consciousness: awake and alert Pain management: pain level controlled Vital Signs Assessment: post-procedure vital signs reviewed and stable Respiratory status: spontaneous breathing, nonlabored ventilation, respiratory function stable and patient connected to nasal cannula oxygen Cardiovascular status: blood pressure returned to baseline and stable Postop Assessment: no apparent nausea or vomiting Anesthetic complications: no  No notable events documented.   Last Vitals:  Vitals:   05/02/22 1743 05/02/22 1800  BP:  110/67  Pulse: 76 71  Resp: 18 15  Temp:  (!) 36.3 C  SpO2: 99% 96%    Last Pain:  Vitals:   05/02/22 1800  TempSrc:   PainSc: 0-No pain                 Dimas Millin

## 2022-05-02 NOTE — Anesthesia Preprocedure Evaluation (Addendum)
Anesthesia Evaluation  Patient identified by MRN, date of birth, ID band Patient awake    Reviewed: Allergy & Precautions, NPO status , Patient's Chart, lab work & pertinent test results  History of Anesthesia Complications Negative for: history of anesthetic complications  Airway Mallampati: II   Neck ROM: Full    Dental no notable dental hx.    Pulmonary neg pulmonary ROS   Pulmonary exam normal breath sounds clear to auscultation       Cardiovascular hypertension, Normal cardiovascular exam Rhythm:Regular Rate:Normal  ECG 05/01/22:  Normal sinus rhythm Nonspecific T wave abnormality   Neuro/Psych  Headaches    GI/Hepatic ,GERD  ,,Last dose of Ozempic 04/27/22   Endo/Other  diabetes, Type 2Hypothyroidism    Renal/GU negative Renal ROS     Musculoskeletal   Abdominal   Peds  Hematology  (+) Blood dyscrasia, anemia   Anesthesia Other Findings   Reproductive/Obstetrics                             Anesthesia Physical Anesthesia Plan  ASA: 2  Anesthesia Plan: General   Post-op Pain Management:    Induction: Intravenous and Rapid sequence  PONV Risk Score and Plan: 3 and Ondansetron, Dexamethasone and Treatment may vary due to age or medical condition  Airway Management Planned: Oral ETT  Additional Equipment:   Intra-op Plan:   Post-operative Plan: Extubation in OR  Informed Consent: I have reviewed the patients History and Physical, chart, labs and discussed the procedure including the risks, benefits and alternatives for the proposed anesthesia with the patient or authorized representative who has indicated his/her understanding and acceptance.     Dental advisory given  Plan Discussed with: CRNA  Anesthesia Plan Comments: (GETA with RSI for last dose of Ozempic on 04/27/22.  Patient consented for risks of anesthesia including but not limited to:  - adverse reactions to  medications - damage to eyes, teeth, lips or other oral mucosa - nerve damage due to positioning  - sore throat or hoarseness - damage to heart, brain, nerves, lungs, other parts of body or loss of life  Informed patient about role of CRNA in peri- and intra-operative care.  Patient voiced understanding.)       Anesthesia Quick Evaluation

## 2022-05-02 NOTE — H&P (Signed)
History of Present Illness:  Beth Williamson is a 51 y.o. female who presents for evaluation and treatment of her lateral sided left elbow pain. The patient notes that her symptoms have been present for over a year and developed without any specific cause or injury. However, the patient works from home for an [employer] and does a lot of computer work, and wonders if this may have contributed to her symptoms. She saw Rachelle Hora, PA-C, for these symptoms in July, 2023. She was diagnosed with lateral epicondylitis and given a steroid injection which she states provided moderate relief of her symptoms for nearly 2 months before her symptoms began to recur. Therefore, the patient was sent for an MRI scan of the left elbow and referred to me to discuss further treatment options. The patient notes that her symptoms are worse with any repetitive activities, including keyboard work, as well as with any grasping activities. She also has difficulty with any lifting of heavier objects. She localizes the pain to the lateral aspect of her elbow with radiation down to her dorsal forearm. She also notes occasional "numbness" to the dorsal forearm region, but denies any numbness or paresthesias to her fingers. She has tried the steroid injection noted above, as well as wearing a Velcro wrist immobilizer. She has also tried various anti-inflammatory medications and has been applying Voltaren gel as well with limited benefit.. The patient continues to perform full duties for her work without restrictions but is having difficulty tolerating this. She is right-hand dominant.  Current Outpatient Medications: ketorolac (TORADOL) 10 mg tablet TAKE ONE TABLET BY MOUTH ONE TIME DAILY AS NEEDED FOR PAIN 10 tablet 0  ketorolac (TORADOL) 60 mg/2 mL injection Inject 1 mL (30 mg total) into the vein continuously as needed 2 mL 3  levothyroxine (SYNTHROID) 150 MCG tablet Take 1 tablet (150 mcg total) by mouth once daily Take on an  empty stomach with a glass of water at least 30-60 minutes before breakfast. 30 tablet 11  multivitamin capsule Take 1 capsule by mouth once daily.  onabotulinumtoxinA (BOTOX) 200 unit SolR MD TO INJECT 200 UNITS INTRAMUSCULARLY INTO FACE AND NECK AREA EVERY 3MONTHS 1 each 3  pantoprazole (PROTONIX) 40 MG DR tablet TAKE ONE TABLET BY MOUTH ONE TIME DAILY 90 tablet 1  pen needle, diabetic (NOVOFINE PLUS) 32 gauge x 1/6" Ndle Use 1 each once a week 12 each 4  promethazine (PHENERGAN) 12.5 MG tablet Take 1 tablet (12.5 mg total) by mouth every 6 (six) hours as needed for Nausea 30 tablet 6  propranoloL (INDERAL) 20 MG tablet Take 1 tablet (20 mg total) by mouth once daily for 360 days 90 tablet 3  rosuvastatin (CRESTOR) 10 MG tablet TAKE ONE TABLET BY MOUTH ONE TIME DAILY 91 tablet 3  semaglutide (OZEMPIC) 1 mg/dose (4 mg/3 mL) pen injector Inject 0.75 mLs (1 mg total) subcutaneously once a week 3 mL 11  traZODone (DESYREL) 100 MG tablet TAKE TWO TABLETS BY MOUTH AT BEDTIME 180 tablet 3   Allergies:  No Known Allergies  Past Medical History:  Arthritis 05/27/2014  Benign hypertension 01/09/2021  Dyslipidemia 01/09/2021  GERD (gastroesophageal reflux disease) 06/2015  Hyperlipidemia  Hypertension  Hypothyroidism 11/18/2013  Migraine headache 11/18/2013  Stage 3b chronic kidney disease (CMS-HCC) 11/26/2021  Type 2 diabetes mellitus with hyperglycemia, without long-term current use of insulin (CMS-HCC) 07/05/2021   Past Surgical History:  HYSTERECTOMY VAGINAL 2015  benign mass.  APPENDECTOMY  childbirth   Family History:  COPD  Mother  High blood pressure (Hypertension) Mother  Hypothyroidism Mother  Migraines Mother  Neuropathy Mother  Coronary Artery Disease (Blocked arteries around heart) Father  Passed away from disease in 09-03-2010  Diabetes Father  Pulmonary embolism Father  Myocardial Infarction (Heart attack) Father  High blood pressure (Hypertension) Father  Migraines Maternal  Grandmother  Hypothyroidism Daughter  Migraines Daughter  Migraines Daughter  Breast cancer Cousin 39  Genetic testing done negative  Breast cancer Cousin  Genetic testing done unsure of results   Social History:   Socioeconomic History:  Marital status: Married  Spouse name: Tommy  Number of children: 1  Occupational History  Occupation: Education officer, 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 1 Adult dtr. Maggie 51 yo.   Review of Systems:  A comprehensive 14 point ROS was performed, reviewed, and the pertinent orthopaedic findings are documented in the HPI.  Physical Exam: Vitals:  04/26/22 0918  BP: 112/82  Weight: 75.3 kg (166 lb)  Height: 154.9 cm ('5\' 1"'$ )  PainSc: 0-No pain  PainLoc: Elbow   General/Constitutional: The patient appears to be well-nourished, well-developed, and in no acute distress. Neuro/Psych: Normal mood and affect, oriented to person, place and time. Eyes: Non-icteric. Pupils are equal, round, and reactive to light, and exhibit synchronous movement. ENT: Unremarkable. Lymphatic: No palpable adenopathy. Respiratory: Lungs clear to auscultation, Normal chest excursion, No wheezes, and Non-labored breathing Cardiovascular: Regular rate and rhythm. No murmurs. and No edema, swelling or tenderness, except as noted in detailed exam. Integumentary: No impressive skin lesions present, except as noted in detailed exam. Musculoskeletal: Unremarkable, except as noted in detailed exam.  Left elbow exam: Skin inspection of  the left elbow is unremarkable. No swelling, erythema, ecchymosis, abrasions, or other skin abnormalities are identified. She is able to actively flex and extend the elbow fully with only mild reproduction of her lateral elbow discomfort at the extremes of flexion and extension. There is no elbow effusion. She has moderate tenderness to palpation over the lateral epicondyle, as well as just distal to the lateral epicondyle. This pain is reproduced with resisted long finger extension, as well as with resisted wrist extension. She also has reproduction of this pain with maximal passive wrist flexion with the elbow extended. She is neurovascular intact to the left forearm and hand.  Imaging:  Recent AP, lateral, and oblique views of the left elbow are available for review and have been reviewed by myself. These films demonstrate no evidence of fractures, lytic lesions, or significant degenerative changes.  A recent MRI scan of the left elbow also is available for review and has been reviewed by myself. By report, the study demonstrates evidence of a partial-thickness tear involving the common extensor origin at the lateral epicondyle. No other acute or chronic bony or soft tissue abnormalities are identified. Both the films and report were reviewed by myself and discussed with the patient and her husband.  Assessment: 1. Lateral epicondylitis of left elbow.   Plan: The treatment options were discussed with the patient and her husband. In addition, patient educational materials were provided regarding  the diagnosis and treatment options. The patient is quite frustrated by her symptoms and functional limitations, and is ready to consider more aggressive treatment options. Therefore, I have recommended a surgical procedure, specifically an open debridement of the common extensor origin of her left elbow, although a PRP procedure also was discussed. This procedure has been discussed in detail, as have the  potential risks (including bleeding, infection, nerve and blood vessel injury, persistent or recurrent pain, stiffness of the elbow, failure of the repair, recurrence of the tear, need for further surgery, blood clots, strokes, heart attacks and/or arrhythmias, etc.) and benefits. All of the patient's questions and concerns were answered. She can call any time with further concerns. She will follow up post-surgery, routine.    H&P reviewed and patient re-examined. No changes.

## 2022-05-02 NOTE — Op Note (Signed)
05/02/2022  5:31 PM  Patient:   Beth Williamson  Pre-Op Diagnosis:   Chronic lateral epicondylitis, left elbow.  Post-Op Diagnosis:   Same.  Procedure:   Debridement/repair of common extensor origin, left elbow.  Surgeon:   Pascal Lux, MD  Assistant:   None  Anesthesia:   GET  Findings:   As above.  Complications:   None  EBL:   1 cc  Fluids:   500 cc crystalloid  TT:   31 minutes at 250 mmHg  Drains:   None  Closure:   3-0 Vicryl subcuticular sutures  Implants:   Biomet JuggerKnot 1.4 mm anchor x1  Brief Clinical Note:   The patient is a 51 year old female with a 1+ year history of lateral sided left elbow pain. Her symptoms have persisted despite medications, activity modification, splinting, a steroid injection, etc. Her history and examination consistent with chronic lateral epicondylitis confirmed by MRI scan, which also showed a partial tear of the extensor carpi radialis brevis tendon. The patient presents at this time for debridement and repair of the common extensor origin of his left elbow.  Procedure:   The patient was brought into the operating room and lain in the supine position. After adequate general endotracheal intubation and anesthesia was obtained, the patient's left upper extremity was prepped with ChloraPrep solution before being draped sterilely. Preoperative antibiotics were administered. A timeout was performed to verify the appropriate surgical site before the limb was exsanguinated with an Esmarch and the tourniquet inflated to 250 mmHg.   An approximately 4-5 cm incision was made over the lateral aspect of the elbow beginning at the lateral epicondyle and extending distally in line with the common extensor origin tendons. The incision was carried down through the subcutaneous tissues to expose the common extensor origin. The extensor carpi radialis brevis tendon was identified and incised in line with the incision. The areas of significantly  degenerative tendinous tissues were debrided sharply with a #15 blade down to the epicondylar region. The bone itself was roughened with a rongeur to provide a good bleeding surface for reattachment of the tendon. A Biomet 1.4 mm JuggerKnot anchor was inserted into the exposed bone of the lateral epicondyle. The sutures were passed through the tendon and tied securely to effect the repair.   The wound was copiously irrigated with bacitracin saline solution using bulb irrigation before several 2-0 Vicryl interrupted sutures were used to close the longitudinal incision in the tendon in a side-to-side fashion. The subcutaneous tissues were closed using 3-0 Vicryl interrupted sutures before the skin was closed using 3-0 Vicryl subcuticular sutures. Benzoin and Steri-Strips were applied to the skin. A total of 15 cc of 0.5% plain Sensorcaine was injected in and around the incision to help with postoperative analgesia before a sterile bulky dressing was applied to the elbow. A Velcro wrist immobilizer also was applied before the patient was awakened, extubated, and returned to the recovery room in satisfactory condition after tolerating the procedure well.

## 2022-05-03 ENCOUNTER — Encounter: Payer: Self-pay | Admitting: Surgery

## 2022-05-06 ENCOUNTER — Encounter: Payer: Self-pay | Admitting: Surgery

## 2022-05-31 ENCOUNTER — Encounter: Payer: Self-pay | Admitting: Surgery

## 2023-03-31 ENCOUNTER — Other Ambulatory Visit: Payer: Self-pay | Admitting: Internal Medicine

## 2023-03-31 DIAGNOSIS — E785 Hyperlipidemia, unspecified: Secondary | ICD-10-CM

## 2023-04-04 ENCOUNTER — Ambulatory Visit
Admission: RE | Admit: 2023-04-04 | Discharge: 2023-04-04 | Disposition: A | Discharge status: Home / Self Care | Payer: No Typology Code available for payment source | Source: Ambulatory Visit | Attending: Internal Medicine | Admitting: Internal Medicine

## 2023-04-04 DIAGNOSIS — E785 Hyperlipidemia, unspecified: Secondary | ICD-10-CM | POA: Insufficient documentation

## 2023-10-06 IMAGING — MG MM DIGITAL SCREENING BILAT W/ TOMO AND CAD
6 of 10 series · 6 of 30 positions shown · non-contrast
Comparison: Previous exam(s).

CLINICAL DATA: Screening.

EXAM:
DIGITAL SCREENING BILATERAL MAMMOGRAM WITH TOMOSYNTHESIS AND CAD
TECHNIQUE: Bilateral screening digital craniocaudal and mediolateral oblique
mammograms were obtained. Bilateral screening digital breast
tomosynthesis was performed. The images were evaluated with
computer-aided detection.

[L CC synth-2D]
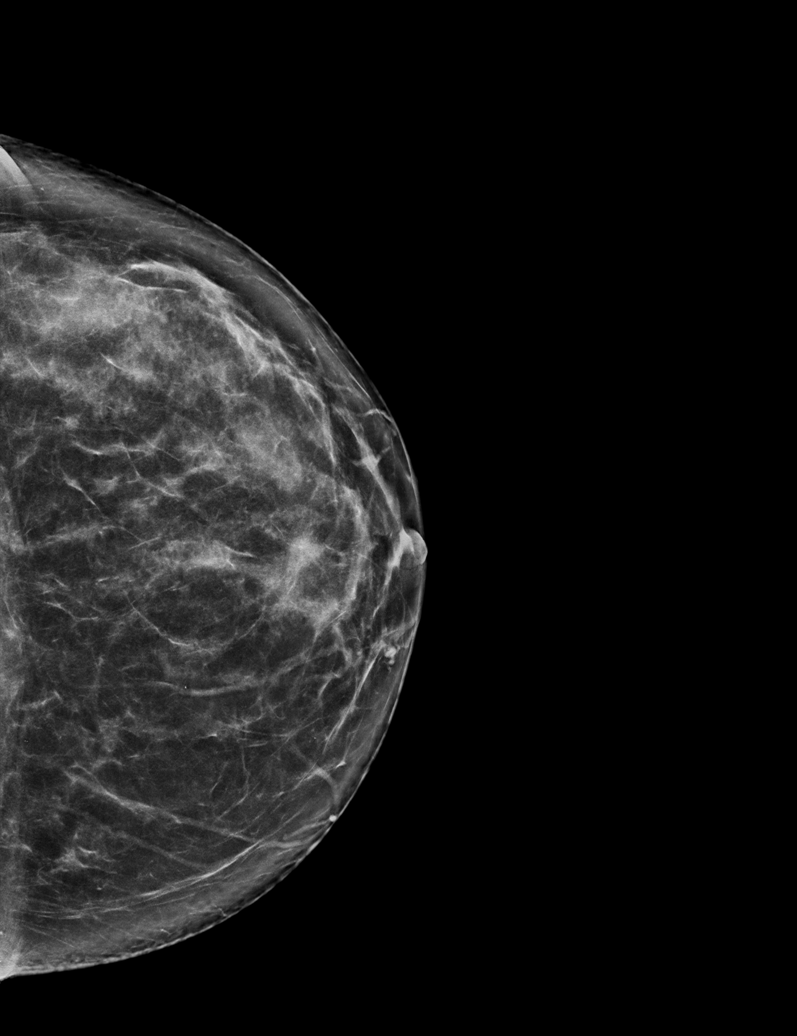

[R CC synth-2D (1 of 2)]
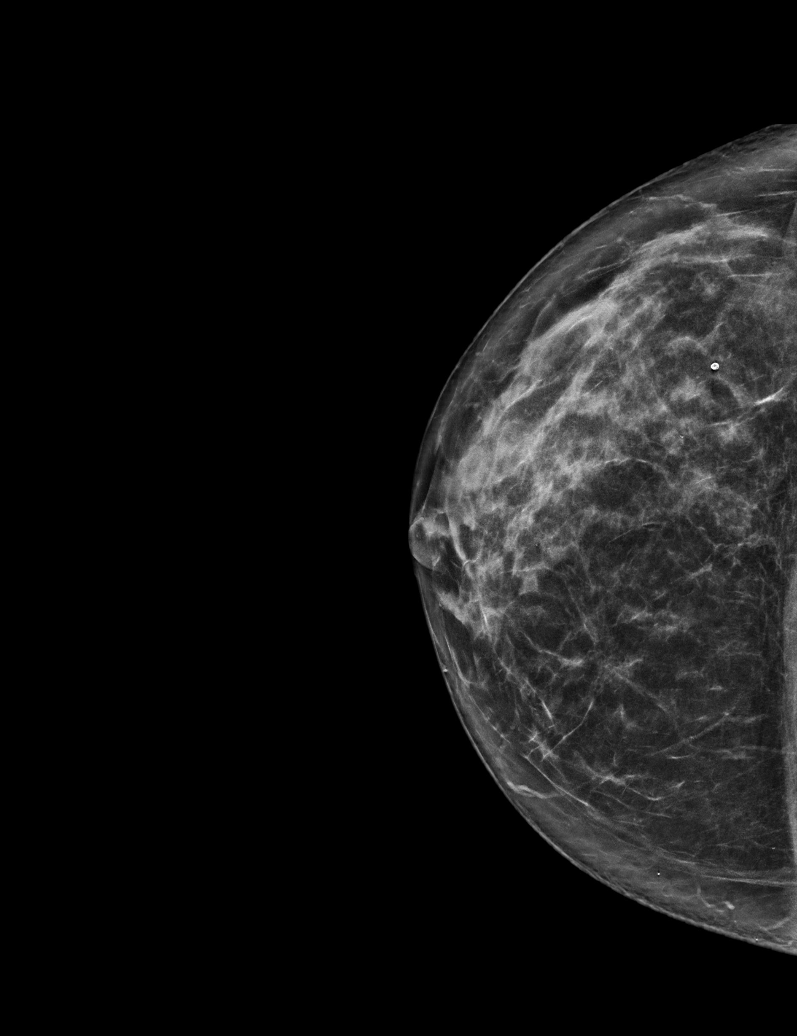

[R MLO synth-2D]
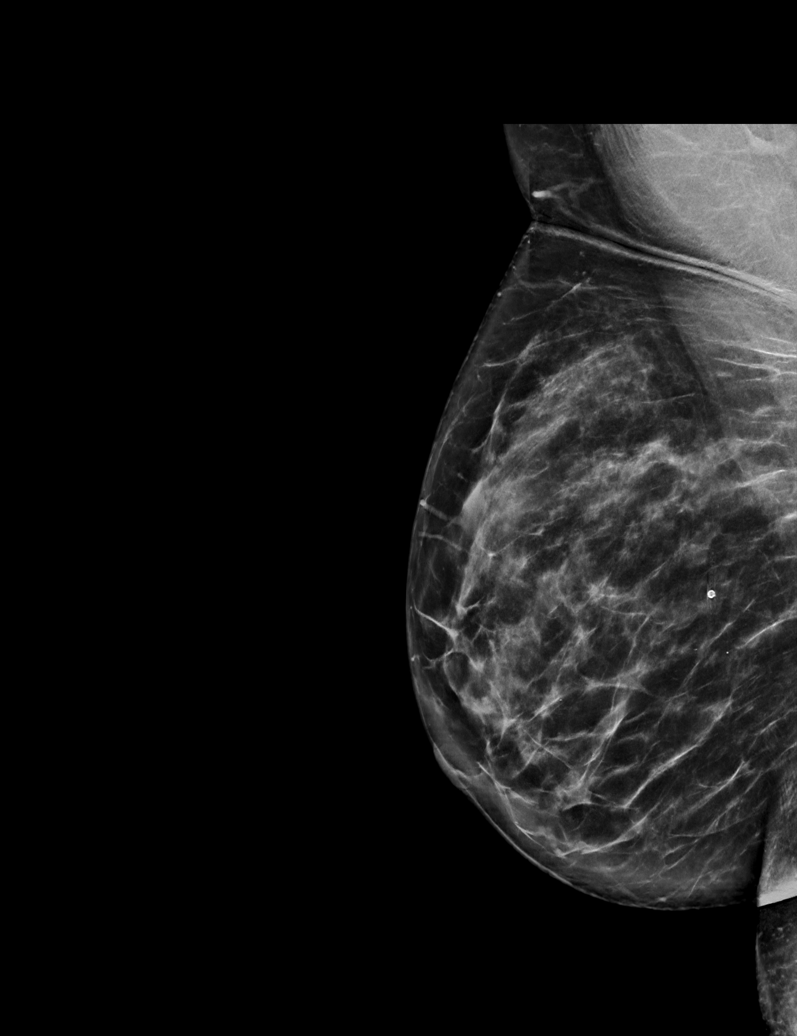

[R CC synth-2D (2 of 2)]
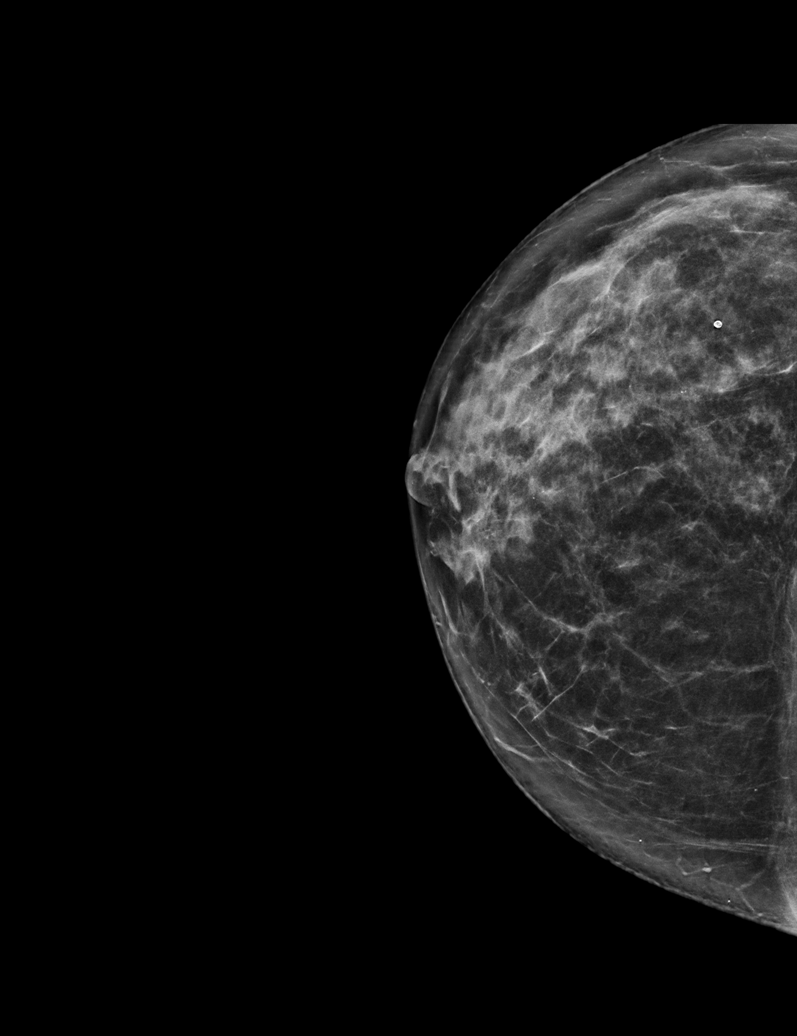

[L MLO synth-2D]
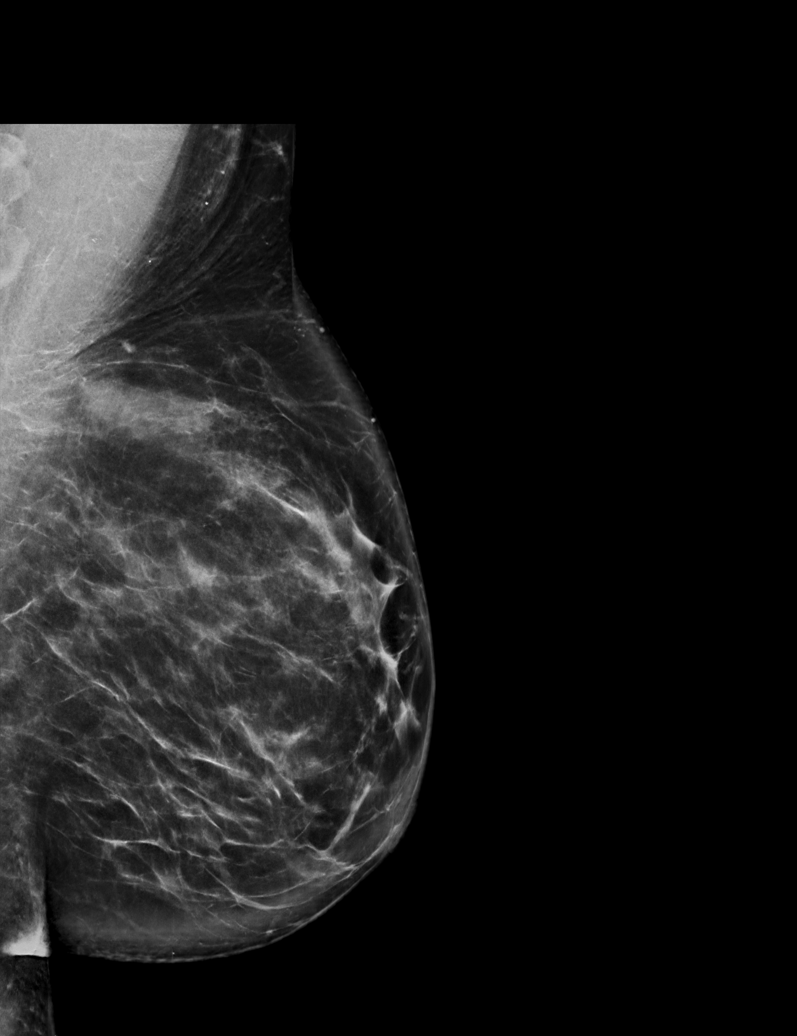

[R CC tomo · tomo slice 34/67.0]
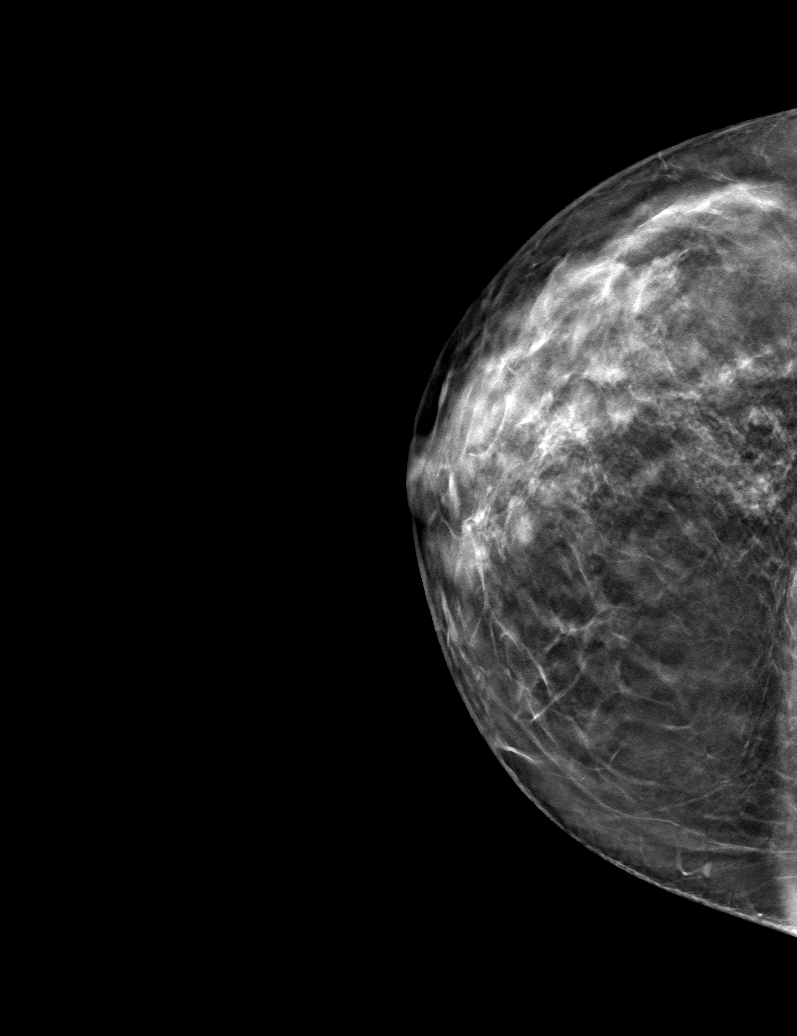

[6 of 30 positions shown; findings below may reference images not displayed]

ACR Breast Density Category c: The breast tissue is heterogeneously
dense, which may obscure small masses.
FINDINGS: In the left breast, a possible asymmetry warrants further
evaluation. In the right breast, no findings suspicious for
malignancy.
IMPRESSION: Further evaluation is suggested for possible asymmetry in the left
breast.

RECOMMENDATION:
Diagnostic mammogram and possibly ultrasound of the left breast.
(Code:[id number]-[id number])

The patient will be contacted regarding the findings, and additional
imaging will be scheduled.

BI-RADS CATEGORY  0: Incomplete. Need additional imaging evaluation
and/or prior mammograms for comparison.

## 2024-05-12 ENCOUNTER — Ambulatory Visit: Payer: Self-pay

## 2024-05-12 DIAGNOSIS — Z1211 Encounter for screening for malignant neoplasm of colon: Secondary | ICD-10-CM | POA: Diagnosis present

## 2024-05-12 DIAGNOSIS — K640 First degree hemorrhoids: Secondary | ICD-10-CM | POA: Diagnosis not present

## 2024-05-12 DIAGNOSIS — Z83719 Family history of colon polyps, unspecified: Secondary | ICD-10-CM | POA: Diagnosis not present

## 2024-05-12 DIAGNOSIS — K635 Polyp of colon: Secondary | ICD-10-CM | POA: Diagnosis not present
